# Patient Record
Sex: Female | Born: 1970 | Race: White | Hispanic: Yes | Marital: Married | Smoking: Never smoker
Health system: Southern US, Community
[De-identification: ages and names within clinical notes are randomized; demographics above are authoritative.]

## PROBLEM LIST (undated history)

## (undated) ENCOUNTER — Emergency Department (HOSPITAL_COMMUNITY): Admission: EM | Payer: No Typology Code available for payment source | Source: Home / Self Care

## (undated) DIAGNOSIS — E785 Hyperlipidemia, unspecified: Secondary | ICD-10-CM

## (undated) HISTORY — PX: INTRAUTERINE DEVICE INSERTION: SHX323

## (undated) HISTORY — DX: Hyperlipidemia, unspecified: E78.5

---

## 2000-11-01 ENCOUNTER — Encounter: Admission: RE | Admit: 2000-11-01 | Discharge: 2000-11-01 | Payer: Self-pay | Admitting: Family Medicine

## 2001-02-09 ENCOUNTER — Other Ambulatory Visit: Admission: RE | Admit: 2001-02-09 | Discharge: 2001-02-09 | Payer: Self-pay | Admitting: Gynecology

## 2001-02-28 ENCOUNTER — Encounter: Payer: Self-pay | Admitting: Gynecology

## 2001-02-28 ENCOUNTER — Ambulatory Visit (HOSPITAL_COMMUNITY): Admission: RE | Admit: 2001-02-28 | Discharge: 2001-02-28 | Payer: Self-pay | Admitting: Gynecology

## 2002-09-22 ENCOUNTER — Other Ambulatory Visit: Admission: RE | Admit: 2002-09-22 | Discharge: 2002-09-22 | Payer: Self-pay | Admitting: Gynecology

## 2006-10-28 ENCOUNTER — Ambulatory Visit: Payer: Self-pay | Admitting: Family Medicine

## 2007-02-22 ENCOUNTER — Ambulatory Visit: Payer: Self-pay | Admitting: Family Medicine

## 2011-03-29 HISTORY — PX: INTRAUTERINE DEVICE INSERTION: SHX323

## 2011-08-18 ENCOUNTER — Ambulatory Visit: Payer: Self-pay | Admitting: Gynecology

## 2011-09-10 ENCOUNTER — Ambulatory Visit: Payer: Self-pay | Admitting: Gynecology

## 2011-10-07 ENCOUNTER — Encounter: Payer: Self-pay | Admitting: Gynecology

## 2011-10-07 ENCOUNTER — Ambulatory Visit (INDEPENDENT_AMBULATORY_CARE_PROVIDER_SITE_OTHER): Payer: Self-pay | Admitting: Gynecology

## 2011-10-07 VITALS — BP 110/70 | Ht 60.0 in | Wt 141.0 lb

## 2011-10-07 DIAGNOSIS — Z01419 Encounter for gynecological examination (general) (routine) without abnormal findings: Secondary | ICD-10-CM

## 2011-10-08 NOTE — Progress Notes (Signed)
Patient presented to the office as a result of an abnormal Pap smear that she had had elsewhere (health Department) patient stated that she was informed to return back in 3 months for followup Pap smear and decided to come for office instead. Patient will reschedule this appointment so that we can obtain the path report and manage accordingly. This was explained to the patient in Spanish all questions are answered.

## 2011-10-20 ENCOUNTER — Encounter: Payer: Self-pay | Admitting: Gynecology

## 2011-10-20 ENCOUNTER — Ambulatory Visit (INDEPENDENT_AMBULATORY_CARE_PROVIDER_SITE_OTHER): Payer: Self-pay | Admitting: Gynecology

## 2011-10-20 ENCOUNTER — Other Ambulatory Visit (HOSPITAL_COMMUNITY)
Admission: RE | Admit: 2011-10-20 | Discharge: 2011-10-20 | Disposition: A | Discharge status: Home / Self Care | Payer: Self-pay | Source: Ambulatory Visit | Attending: Gynecology | Admitting: Gynecology

## 2011-10-20 VITALS — BP 110/70 | Ht 60.0 in | Wt 142.0 lb

## 2011-10-20 DIAGNOSIS — Z01419 Encounter for gynecological examination (general) (routine) without abnormal findings: Secondary | ICD-10-CM | POA: Insufficient documentation

## 2011-10-20 DIAGNOSIS — Z124 Encounter for screening for malignant neoplasm of cervix: Secondary | ICD-10-CM

## 2011-10-20 DIAGNOSIS — N870 Mild cervical dysplasia: Secondary | ICD-10-CM

## 2011-10-20 DIAGNOSIS — R87810 Cervical high risk human papillomavirus (HPV) DNA test positive: Secondary | ICD-10-CM | POA: Insufficient documentation

## 2011-10-20 NOTE — Progress Notes (Addendum)
Patient is a 40 year old gravida 3 para 3 who presented to the office today for followup Pap smear due to the fact that she was receiving her Pap smears and gynecological examinations at the [facility] health Department. In April of this year she had a ParaGard T380A IUD placed her Pap smear had demonstrated low-grade SIL and subsequently underwent colposcopic evaluation at same facility and pathology report demonstrated low-grade SIL and benign ECC cytology and histology both coincided.  Colposcopic evaluation done in the office today: External genitalia vagina with no gross lesions noted cervix IUD string was seen transformation zone was visualized no abnormalities were detected. Her Pap smear was repeated as well.  New guidelines and followup were discussed with the patient in Spanish. She has never had abnormal Pap smear before. Will followup in 6 months at time of her annual exam her Pap smear. We'll continue to monitor conservatively since she will have good compliance and will repeat colposcopy and high-grade abnormalities noted on cytology. Literature formation was provided as well. Application of her mammogram scar shipped was provided as well. All questions were answered we'll follow accordingly.

## 2011-10-20 NOTE — Progress Notes (Signed)
Addended by: Bertram Savin A on: 10/20/2011 10:57 AM   Modules accepted: Orders

## 2012-04-22 ENCOUNTER — Ambulatory Visit (INDEPENDENT_AMBULATORY_CARE_PROVIDER_SITE_OTHER): Payer: Self-pay | Admitting: Gynecology

## 2012-04-22 ENCOUNTER — Encounter: Payer: Self-pay | Admitting: Gynecology

## 2012-04-22 ENCOUNTER — Other Ambulatory Visit (HOSPITAL_COMMUNITY)
Admission: RE | Admit: 2012-04-22 | Discharge: 2012-04-22 | Disposition: A | Discharge status: Home / Self Care | Payer: Self-pay | Source: Ambulatory Visit | Attending: Gynecology | Admitting: Gynecology

## 2012-04-22 VITALS — BP 122/78 | Ht 60.0 in | Wt 137.0 lb

## 2012-04-22 DIAGNOSIS — Z01419 Encounter for gynecological examination (general) (routine) without abnormal findings: Secondary | ICD-10-CM

## 2012-04-22 DIAGNOSIS — Z833 Family history of diabetes mellitus: Secondary | ICD-10-CM

## 2012-04-22 DIAGNOSIS — I8390 Asymptomatic varicose veins of unspecified lower extremity: Secondary | ICD-10-CM

## 2012-04-22 DIAGNOSIS — I8393 Asymptomatic varicose veins of bilateral lower extremities: Secondary | ICD-10-CM

## 2012-04-22 DIAGNOSIS — R635 Abnormal weight gain: Secondary | ICD-10-CM

## 2012-04-22 LAB — LIPID PANEL
Cholesterol: 218 mg/dL — ABNORMAL HIGH (ref 0–200)
HDL: 69 mg/dL (ref >39)
LDL Cholesterol: 129 mg/dL — ABNORMAL HIGH (ref 0–99)
Total CHOL/HDL Ratio: 3.2 Ratio
Triglycerides: 100 mg/dL (ref <150)
VLDL: 20 mg/dL (ref 0–40)

## 2012-04-22 LAB — CBC WITH DIFFERENTIAL/PLATELET
Basophils Absolute: 0 10*3/uL (ref 0.0–0.1)
Basophils Relative: 0 % (ref 0–1)
Eosinophils Absolute: 0.1 10*3/uL (ref 0.0–0.7)
Eosinophils Relative: 2 % (ref 0–5)
HCT: 43.7 % (ref 36.0–46.0)
Hemoglobin: 13.6 g/dL (ref 12.0–15.0)
Lymphocytes Relative: 26 % (ref 12–46)
Lymphs Abs: 1.4 10*3/uL (ref 0.7–4.0)
MCH: 29.2 pg (ref 26.0–34.0)
MCHC: 31.1 g/dL (ref 30.0–36.0)
MCV: 94 fL (ref 78.0–100.0)
Monocytes Absolute: 0.3 10*3/uL (ref 0.1–1.0)
Monocytes Relative: 6 % (ref 3–12)
Neutro Abs: 3.5 10*3/uL (ref 1.7–7.7)
Neutrophils Relative %: 66 % (ref 43–77)
Platelets: 209 10*3/uL (ref 150–400)
RBC: 4.65 MIL/uL (ref 3.87–5.11)
RDW: 14.2 % (ref 11.5–15.5)
WBC: 5.3 10*3/uL (ref 4.0–10.5)

## 2012-04-22 LAB — GLUCOSE, RANDOM: Glucose, Bld: 93 mg/dL (ref 70–99)

## 2012-04-22 NOTE — Patient Instructions (Signed)
Control del colesterol  Los niveles de colesterol en el organismo estn determinados significativamente por su dieta. Los niveles de colesterol tambin se relacionan con la enfermedad cardaca. El material que sigue ayuda a explicar esta relacin y a analizar qu puede hacer para mantener su corazn sano. No todo el colesterol es malo. Las lipoprotenas de baja densidad (LDL) forman el colesterol "malo". El colesterol malo puede ocasionar depsitos de grasa que se acumulan en el interior de las arterias. Las lipoprotenas de alta densidad (HDL) es el colesterol "bueno". Ayuda a remover el colesterol LDL "malo" de la sangre. El colesterol es un factor de riesgo muy importante para la enfermedad cardaca. Otros factores de riesgo son la hipertensin arterial, el hbito de fumar, el estrs, la herencia y el peso.   El msculo cardaco obtiene el suministro de sangre a travs de las arterias coronarias. Si su colesterol LDL ("malo") est elevado y el HDL ("bueno") es bajo, tiene un factor de riesgo para que se formen depsitos de grasa en las arterias coronarias (los vasos sanguneos que suministran sangre al corazn). Esto hace que haya menos lugar para que la sangre circule. Sin la suficiente sangre y oxgeno, el msculo cardaco no puede funcionar correctamente, y usted podr sentir dolores en el pecho (angina pectoris). Cuando una arteria coronaria se cierra completamente, una parte del msculo cardaco puede morir (infarto de miocardio).  CONTROL DEL COLESTEROL Cuando el profesional que lo asiste enva la sangre al laboratorio para conocer el nivel de colesterol, puede realizarle tambin un perfil completo de los lpidos. Con esta prueba, se puede determinar la cantidad total de colesterol, as como los niveles de LDL y HDL. Los triglicridos son un tipo de grasa que circula en la sangre y que tambin puede utilizarse para determinar el riesgo de enfermedad  cardaca. En la siguiente tabla se establecen los nmeros ideales: Prueba: Colesterol total  Menos de 200 mg/dl.  Prueba: LDL "colesterol malo"  Menos de 100 mg/dl.   Menos de 70 mg/dl si tiene riesgo muy elevado de sufrir un ataque cardaco o muerte cardaca sbita.  Prueba: HDL "colesterol bueno"  Mujeres: Ms de 50 mg/dl.   Hombres: Ms de 40 mg/dl.  Prueba: Trigliceridos  Menos de 150 mg/dl.    CONTROL DEL COLESTEROL CON DIETA Aunque factores como el ejercicio y el estilo de vida son importantes, la "primera lnea de ataque" es la dieta. Esto se debe a que se sabe que ciertos alimentos hacen subir el colesterol y otros lo bajan. El objetivo debe ser equilibrar los alimentos, de modo que tengan un efecto sobre el colesterol y, an ms importante, reemplazar las grasas saturadas y trans con otros tipos de grasas, como las monoinsaturadas y las poliinsaturadas y cidos grasos omega-3 . En promedio, una persona no debe consumir ms de 15 a 17 g de grasas saturadas por da. Las grasas saturadas y trans se consideran grasas "malas", ya que elevan el colesterol LDL. Las grasas saturadas se encuentran principalmente en productos animales como carne, manteca y crema. Pero esto no significa que usted debe sacrificar todas sus comidas favoritas. Actualmente, como lo muestra el cuadro que figura al final de este documento, hay sustitutos de buen sabor, bajos en grasas y en colesterol, para la mayora de los alimentos que a usted le gusta comer. Elija aquellos alimentos alternativos que sean bajos en grasas o sin grasas. Elija cortes de carne del cuarto trasero o lomo ya que estos cortes son los que tienen menor cantidad de grasa   y colesterol. El pollo (sin piel), el pescado, la carne de ternera, y la pechuga de pavo molida son excelentes opciones. Elimine las carnes grasosas como los hotdogs o el salami. Los mariscos tienen poco o nada de grasas saturadas. Cuando consuma carne magra, carne de aves de  corral, o pescado, hgalo en porciones de 85 gramos (3 onzas). Las grasas trans tambin se llaman "aceites parcialmente hidrogenados". Son aceites manipulados cientficamente de modo que son slidos a temperatura ambiente, tienen una larga vida y mejoran el sabor y la textura de los alimentos a los que se agregan. Las grasas trans se encuentran en la margarina, masitas, crackers y alimentos horneados.  Para hornear y cocinar, el aceite es un excelente sustituto para la mantequilla. Los aceites monoinsaturados tienen un beneficio particular, ya que se cree que disminuyen el colesterol LDL (colesterol malo) y elevan el HDL. Deber evitar los aceites tropicales saturados como el de coco y el de palma.  Recuerde, adems, que puede comer sin restricciones los grupos de alimentos que son naturalmente libres de grasas saturadas y grasas trans, entre los que se incluyen el pescado, las frutas (excepto el aguacate), verduras, frijoles, cereales (cebada, arroz, cuzcuz, trigo) y las pastas (sin salsas con crema)   IDENTIFIQUE LOS ALIMENTOS QUE DISMINUYEN EL COLESTEROL  Pueden disminuir el colesterol las fibras solubles que estn en las frutas, como las manzanas, en los vegetales como el brcoli, las patatas y las zanahorias; en las legumbres como frijoles, guisantes y lentejas; y en los cereales como la cebada. Los alimentos fortificados con fitosteroles tambin pueden disminuir el colesterol. Debe consumir al menos 2 g de estos alimentos a diario para obtener el efecto de disminucin de colesterol.  En el supermercado, lea las etiquetas de los envases para identificar los alimentos bajos en grasas saturadas, libres de grasas trans y bajos en grasas, . Elija quesos que tengan solo de 2 a 3 g de grasa saturada por onza (28,35 g). Use una margarina que no dae el corazn, libre de grasas trans o aceite parcialmente hidrogenado. Al comprar alimentos horneados (galletitas dulces y galletas) evite el aceite parcialmente  hidrogenado. Los panes y bollos debern ser de granos enteros (harina de maz o de avena entera, en lugar de "harina" o "harina enriquecida"). Compre sopas en lata que no sean cremosas, con bajo contenido de sal y sin grasas adicionadas.   TCNICAS DE PREPARACIN DE LOS ALIMENTOS  Nunca fra los alimentos en aceite abundante. Si debe frer, hgalo en poco aceite y removiendo constantemente, porque as se utilizan muy pocas grasas, o utilice un spray antiadherente. Cuando le sea posible, hierva, hornee o ase las carnes y cocine los vegetales al vapor. En vez de aderezar los vegetales con mantequilla o margarina, utilice limn y hierbas, pur de manzanas y canela (para las calabazas y batatas), yogurt y salsa descremados y aderezos para ensaladas bajos en contenido graso.   BAJO EN GRASAS SATURADAS / SUSTITUTOS BAJOS EN GRASA  Carnes / Grasas saturadas (g)  Evite: Bife, corte graso (3 oz/85 g) / 11 g   Elija: Bife, corte magro (3 oz/85 g) / 4 g   Evite: Hamburguesa (3 oz/85 g) / 7 g   Elija:  Hamburguesa magra (3 oz/85 g) / 5 g   Evite: Jamn (3 oz/85 g) / 6 g   Elija:  Jamn magro (3 oz/85 g) / 2.4 g   Evite: Pollo, con piel (3 oz/85 g), Carne oscura / 4 g   Elija:  Pollo, sin piel (  3 oz/85 g), Carne oscura / 2 g   Evite: Pollo, con piel (3 oz/85 g), Carne magra / 2.5 g   Elija: Pollo, sin piel (3 oz/85 g), Carne magra / 1 g  Lcteos / Grasas saturadas (g)  Evite: Leche entera (1 taza) / 5 g   Elija: Leche con bajo contenido de grasa, 2% (1 taza) / 3 g   Elija: Leche con bajo contenido de grasa, 1% (1 taza) / 1.5 g   Elija: Leche descremada (1 taza) / 0.3 g   Evite: Queso duro (1 oz/28 g) / 6 g   Elija: Queso descremado (1 oz/28 g) / 2-3 g   Evite: Queso cottage, 4% grasa (1 taza)/ 6.5 g   Elija: Queso cottage con bajo contenido de grasa, 1% grasa (1 taza)/ 1.5 g   Evite: Helado (1 taza) / 9 g   Elija: Sorbete (1 taza) / 2.5 g   Elija: Yogurt helado sin contenido de  grasa (1 taza) / 0.3 g   Elija: Barras de fruta congeladas / vestigios   Evite: Crema batida (1 cucharada) / 3.5 g   Elija: Batidos glac sin lcteos (1 cucharada) / 1 g  Condimentos / Grasas saturadas (g)  Evite: Mayonesa (1 cucharada) / 2 g   Elija: Mayonesa con bajo contenido de grasa (1 cucharada) / 1 g   Evite: Manteca (1 cucharada) / 7 g   Elija: Margarina extra light (1 cucharada) / 1 g   Evite: Aceite de coco (1 cucharada) / 11.8 g   Elija: Aceite de oliva (1 cucharada) / 1.8 g   Elija: Aceite de maz (1 cucharada) / 1.7 g   Elija: Aceite de crtamo (1 cucharada) / 1.2 g   Elija: Aceite de girasol (1 cucharada) / 1.4 g   Elija: Aceite de soja (1 cucharada) / 2.4 g   Elija: Aceite de canola (1 cucharada) / 1 g  Document Released: 12/14/2005 Document Revised: 08/26/2011 ExitCare Patient Information 2012 [employer]. Ejercicios para perder peso (Exercise to Lose Weight) La actividad fsica y una dieta saludable ayudan a perder peso. El mdico podr sugerirle ejercicios especficos. IDEAS Y CONSEJOS PARA HACER EJERCICIOS  Elija opciones econmicas que disfrute hacer , como caminar, andar en bicicleta o los vdeos para ejercitarse.   Utilice las escaleras en lugar del ascensor.   Camine durante la hora del almuerzo.   Estacione el auto lejos del lugar de trabajo o estudio.   Concurra a un gimnasio o tome clases de gimnasia.   Comience con 5  10 minutos de actividad fsica por da. Ejercite hasta 30 minutos, 4 a 6 das por semana.   Utilice zapatos que tengan un buen soporte y ropas cmodas.   Elongue antes y despus de ejercitar.   Ejercite hasta que aumente la respiracin y el corazn palpite rpido.   Beba agua extra cuando ejercite.   No haga ejercicio hasta lastimarse, sentirse mareado o que le falte mucho el aire.  La actividad fsica puede quemar alrededor de 150 caloras.  Correr 20 cuadras en 15 minutos.   Jugar vley durante 45 a 60  minutos.   Limpiar y encerar el auto durante 45 a 60 minutos.   Jugar ftbol americano de toque.   Caminar 25 cuadras en 35 minutos.   Empujar un cochecito 20 cuadras en 30 minutos.   Jugar baloncesto durante 30 minutos.   Rastrillar hojas secas durante 30 minutos.   Andar en bicicleta 80 cuadras en 30 minutos.     Caminar 30 cuadras en 30 minutos.   Bailar durante 30 minutos.   Quitar la nieve con una pala durante 15 minutos.   Nadar vigorosamente durante 20 minutos.   Subir escaleras durante 15 minutos.   Andar en bicicleta 60 cuadras durante 15 minutos.   Arreglar el jardn entre 30 y 45 minutos.   Saltar a la soga durante 15 minutos.   Limpiar vidrios o pisos durante 45 a 60 minutos.  Document Released: 03/20/2011 Document Revised: 08/26/2011 Exit[employer] Patient Information 2012 [employer]. 

## 2012-04-22 NOTE — Progress Notes (Signed)
Christy Taylor 1971/04/28 [id number]   History:    41 y.o.  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 We had discussed the new screening guidelines and she was recommended to followup with me in 6 months which she is today for her Pap smear as well as her gynecological exam. Patient is having normal menstrual cycles. Patient with no prior mammograms. Patient complaining of some left lower leg varicosities. Patient with strong family history diabetes breast 2 brothers and her mother have the disease.  Past medical history,surgical history, family history and social history were all reviewed and documented in the EPIC chart.  Gynecologic History Patient's last menstrual period was 04/14/2012. Contraception: IUD Last Pap: 2012. Results were: Low-grade squamous intraepithelial lesion Last mammogram: No prior study. Results were: No prior study  Obstetric History OB History    Grav Para Term Preterm Abortions TAB SAB Ect Mult Living   3 3        3      # Outc Date GA Lbr Len/2nd Wgt Sex Del Anes PTL Lv   1 PAR     F SVD      2 PAR     M SVD      3 PAR     F SVD          ROS:  Was performed and pertinent positives and negatives are included in the history.  Exam: chaperone present  BP 122/78  Ht 5' (1.524 m)  Wt 137 lb (62.143 kg)  BMI 26.76 kg/m2  LMP 04/14/2012  Body mass index is 26.76 kg/(m^2).  General appearance : Well developed well nourished female. No acute distress HEENT: Neck supple, trachea midline, no carotid bruits, no thyroidmegaly Lungs: Clear to auscultation, no rhonchi or wheezes, or rib retractions  Heart: Regular rate and rhythm, no murmurs or  gallops Breast:Examined in sitting and supine position were symmetrical in appearance, no palpable masses or tenderness,  no skin retraction, no nipple inversion, no nipple discharge, no skin discoloration, no axillary or supraclavicular lymphadenopathy Abdomen: no palpable masses or tenderness, no rebound or guarding Extremities: no edema or skin discoloration or tenderness, left lower leg varicosities prominent raised but nontender.  Pelvic:  Bartholin, Urethra, Skene Glands: Within normal limits             Vagina: No gross lesions or discharge  Cervix: No gross lesions or discharge, IUD string seen  Uterus  anteverted, normal size, shape and consistency, non-tender and mobile  Adnexa  Without masses or tenderness  Anus and perineum  normal   Rectovaginal  normal sphincter tone without palpated masses or tenderness             Hemoccult not done. Rectal exam otherwise normal     Assessment/Plan:  41 y.o. female for annual exam with prior history of low-grade squamous intraepithelial lesion positive HPV confirmed by previous biopsy at the health department. Last Pap smear October 2012 with low-grade SIL. Pap smear repeated today. Mammogram will be scheduled for her baseline. Patient encouraged to do her monthly self breast examination. Patient will be referred to the vein clinic for her left lower extremity varicosities which are very prominent. She'll use support stockings while she is ambulating and working. Because  of her strong family history diabetes a fasting blood sugar will be drawn today and we'll also check a fasting lipid profile along with her CBC and urinalysis. We'll wait for the results of this Pap smear and manage accordingly. New guidelines had been discussed with the patient as well in Spanish.    Ok Edwards MD, 11:33 AM 04/22/2012

## 2012-04-23 LAB — URINALYSIS W MICROSCOPIC + REFLEX CULTURE
Bacteria, UA: NONE SEEN
Bilirubin Urine: NEGATIVE
Casts: NONE SEEN
Crystals: NONE SEEN
Glucose, UA: NEGATIVE mg/dL
Hgb urine dipstick: NEGATIVE
Ketones, ur: NEGATIVE mg/dL
Leukocytes, UA: NEGATIVE
Nitrite: NEGATIVE
Protein, ur: NEGATIVE mg/dL
Specific Gravity, Urine: 1.019 (ref 1.005–1.030)
Urobilinogen, UA: 0.2 mg/dL (ref 0.0–1.0)
pH: 8 (ref 5.0–8.0)

## 2013-07-18 ENCOUNTER — Encounter: Payer: Self-pay | Admitting: Gynecology

## 2013-10-19 ENCOUNTER — Other Ambulatory Visit (HOSPITAL_COMMUNITY): Payer: Self-pay | Admitting: *Deleted

## 2013-10-19 DIAGNOSIS — N6320 Unspecified lump in the left breast, unspecified quadrant: Secondary | ICD-10-CM

## 2013-10-24 ENCOUNTER — Ambulatory Visit (HOSPITAL_COMMUNITY)
Admission: RE | Admit: 2013-10-24 | Discharge: 2013-10-24 | Disposition: A | Discharge status: Home / Self Care | Payer: Self-pay | Source: Ambulatory Visit | Attending: Obstetrics and Gynecology | Admitting: Obstetrics and Gynecology

## 2013-10-24 ENCOUNTER — Encounter (INDEPENDENT_AMBULATORY_CARE_PROVIDER_SITE_OTHER): Payer: Self-pay

## 2013-10-24 ENCOUNTER — Encounter (HOSPITAL_COMMUNITY): Payer: Self-pay

## 2013-10-24 VITALS — BP 106/58 | Temp 98.4°F | Ht 60.0 in | Wt 134.0 lb

## 2013-10-24 DIAGNOSIS — Z1239 Encounter for other screening for malignant neoplasm of breast: Secondary | ICD-10-CM

## 2013-10-24 NOTE — Progress Notes (Signed)
Complaints of left breast lump x several months that has not changed since last mammogram. Complaints of left breast pain that feels like needles poking that comes and goes. Patient rated patient at a 3 out of 10. Patient stated the pain is about the same as it was at prior exam. Patient stated she does have an increase of breast pain prior to menstrual cycle. Patient had a diagnostic mammogram at [facility] June 2014 with 6 month left breast ultrasound follow up recommended for December 2014.  Pap Smear:    Pap smear not completed today. Last Pap smear was May 2014 by Dr. Quintella Reichert and normal per patient. Per patient has no history of an abnormal Pap smear. Pap smear 04/22/2012 is in EPIC. Last Pap smear is not in EPIC.  Physical exam: Breasts Breasts symmetrical. No skin abnormalities bilateral breasts. No nipple retraction bilateral breasts. No nipple discharge bilateral breasts. No lymphadenopathy. No lumps palpated bilateral breasts. Complaints of left center breast tenderness on exam. Referred patient to [facility] per recommendation for her 6 month follow up left breast ultrasound. Appointment scheduled for Friday, December 08, 2013 at 1000.       Pelvic/Bimanual No Pap smear completed today since last Pap smear was May 2014. Pap smear not indicated per BCCCP guidelines.

## 2013-10-24 NOTE — Patient Instructions (Addendum)
Taught Christy Taylor how to perform BSE. Patient did not need a Pap smear today due to last Pap smear was May 2014 per patient. Let her know BCCCP will cover Pap smears every 3 years unless has a history of abnormal Pap smears. Referred patient to [facility] per recommendation for her 6 month follow up left breast ultrasound. Appointment scheduled for Friday, December 08, 2013 at 1000. Patient aware of appointment and will be there. Let her know if she notices any changes prior to her appointment to call and will get her in sooner for follow up.  [name] verbalized understanding.  Lurene Robley, Kathaleen Maser, RN @T @ 1:57 PM

## 2013-11-07 ENCOUNTER — Other Ambulatory Visit: Payer: Self-pay

## 2014-01-17 ENCOUNTER — Encounter (HOSPITAL_COMMUNITY): Payer: Self-pay

## 2014-04-20 ENCOUNTER — Encounter (HOSPITAL_COMMUNITY): Payer: Self-pay | Admitting: *Deleted

## 2014-10-29 ENCOUNTER — Encounter (HOSPITAL_COMMUNITY): Payer: Self-pay

## 2015-02-22 ENCOUNTER — Encounter: Payer: Self-pay | Admitting: Medical

## 2015-02-22 ENCOUNTER — Ambulatory Visit (INDEPENDENT_AMBULATORY_CARE_PROVIDER_SITE_OTHER): Payer: BLUE CROSS/BLUE SHIELD | Admitting: Medical

## 2015-02-22 VITALS — BP 109/66 | HR 68 | Temp 98.0°F | Ht 60.0 in | Wt 138.0 lb

## 2015-02-22 DIAGNOSIS — Z0000 Encounter for general adult medical examination without abnormal findings: Secondary | ICD-10-CM

## 2015-02-22 DIAGNOSIS — Z23 Encounter for immunization: Secondary | ICD-10-CM

## 2015-02-22 HISTORY — DX: Encounter for general adult medical examination without abnormal findings: Z00.00

## 2015-02-22 NOTE — Assessment & Plan Note (Signed)
Will do labs cbc, cmp, tsh, lipid panel in one week fasting.  Pt advised to call gyn Lily PeerFernandez to repeat mammo and pap(since hx of abnromal).  Tdap offered today.  Counseled pt on her ha one month ago with rt trapezius pain. If at any point ha with associated sigs or symptoms as exlpained then ED evaluation.

## 2015-02-22 NOTE — Progress Notes (Signed)
   Subjective:    Patient ID: Christy Taylor, female    DOB: [birth year], 44 y.o.   MRN: [id number]  HPI   I have reviewed pt PMH, PSH, FH, Social History and Surgical History  Pt has hx of hyperlipidemia- Dx mild elevation. No med. Told diet and exercise.  Varicose veins- Lt side leg.  No operation or surgeries.  LMP- February. Has IUD.  [employer] work in [employer], [employer]. NO exercises, No smoker, NO alcohol. 3 children.  Pt has no current symptoms. Today.  Pt did mention one week ago had temporal ha and rt trapezius pain. Last for 10 minutes. Then went away. Husband massaged her neck. She felt rt arm weakness but states she could move arm fine and no numbness. This happened only one time. She was very stressed at that time.  No hx of ha.     Review of Systems  Constitutional: Negative for fever, chills, diaphoresis, activity change, appetite change and fatigue.  HENT: Negative for congestion.   Respiratory: Negative for cough, choking, chest tightness and shortness of breath.   Cardiovascular: Negative for chest pain, palpitations and leg swelling.  Gastrointestinal: Negative for nausea, vomiting and abdominal pain.  Musculoskeletal: Negative for back pain, neck pain and neck stiffness.  Neurological: Negative for dizziness, tremors, seizures, syncope, facial asymmetry, speech difficulty, weakness, light-headedness, numbness and headaches.       None presently but see hpi.  Psychiatric/Behavioral: Negative for behavioral problems, confusion and agitation. The patient is not nervous/anxious.        Objective:   Physical Exam   General Mental Status- Alert. General Appearance- Not in acute distress.   Skin General: Color- Normal Color. Moisture- Normal Moisture.  Neck Carotid Arteries- Normal color. Moisture- Normal Moisture. No carotid bruits. No JVD.  Chest and Lung Exam Auscultation: Breath Sounds:-Normal.  Cardiovascular Auscultation:Rythm- Regular. Murmurs & Other  Heart Sounds:Auscultation of the heart reveals- No Murmurs.  Abdomen Inspection:-Inspeection Normal. Palpation/Percussion:Note:No mass. Palpation and Percussion of the abdomen reveal- Non Tender, Non Distended + BS, no rebound or guarding.    Neurologic Cranial Nerve exam:- CN III-XII intact(No nystagmus), symmetric smile. Drift Test:- No drift. Romberg Exam:- Negative.  Heal to Toe Gait exam:-Normal. Finger to Nose:- Normal/Intact Strength:- 5/5 equal and symmetric strength both upper and lower extremities.  Back- no cva tenderness.       Assessment & Plan:

## 2015-02-22 NOTE — Patient Instructions (Signed)
Wellness examination Will do labs cbc, cmp, tsh, lipid panel in one week fasting.  Pt advised to call gyn Toney Rakes to repeat mammo and pap(since hx of abnromal).  Tdap offered today.  Counseled pt on her ha one month ago with rt trapezius pain. If at any point ha with associated sigs or symptoms as exlpained then ED evaluation.     Cuidados preventivos en los adultos (Preventive Care for Adults) Un estilo de vida saludable y los cuidados preventivos pueden favorecer la salud y [place]. Las pautas de salud preventivas para las mujeres incluyen las siguientes prcticas clave:  Un examen fsico de rutina anual y Optometrist estudios preventivos es un buen modo de Chief Technology Officer su salud. [place] de Publishing rights manager preocupaciones y Civil engineer, contracting el estado de su salud, y que le realicen estudios completos.  Consulte al dentista para realizar un examen de rutina y cuidados preventivos cada 6 meses. Cepllese los dientes al [employer]ys[employer] veces por da y psese el hilo dental al menos una vez por da. Una buena higiene bucal evita caries y enfermedades de las encas.  La frecuencia con que debe hacerse exmenes de la vista depende de su edad, su estado de [place], su historia familiar, el uso de lentes de contacto y otros factores. Siga las recomendaciones del mdico para saber con qu frecuencia debe hacerse exmenes de la vista.  Consuma una dieta saludable. Los alimentos que contienen vegetales, las frutas, los cereales [place], los productos lcteos bajos en grasas y las protenas magras contienen nutrientes que son necesarios, sin consumir Nurse, mental health. Disminuya la ingesta de alimentos ricos en grasas slidas, azcares y sal agregadas. Consuma la cantidad de caloras adecuada para usted. Si es necesario, pdale informacin acerca de una dieta [place] a su mdico.  Realizar actividad fsica de forma regular es una de las prcticas ms importantes que puede hacer por su salud. Los adultos  deben hacer al menos 150 minutos de ejercicios de intensidad moderada (cualquier actividad que aumente la frecuencia cardaca y lo haga transpirar) cada semana. Adems, la [employer] de los adultos necesita practicar ejercicios de fortalecimiento muscular dos o ms veces por semana.  Mantenga un peso saludable. El ndice de masa corporal [facility]) es una herramienta que identifica posibles problemas con [place]. Proporciona una estimacin de la grasa corporal basndose en el peso y la altura. El mdico podr determinar su [facility] y ayudarlo a Scientist, forensic o Theatre manager un peso saludable. Para los adultos de 20 aos o ms:  Un [facility] menor de 18,5 se considera bajo peso.  Un [facility] entre 18,5 y 24,9 es normal.  Un [facility] entre 25 y 29,9 se considera sobrepeso.  Un IMC de 30 o ms se considera obesidad.  Realice actividad fsica y evite ingerir grasas saturadas para mantener un nivel normal de lpidos y Research scientist (life sciences). Consuma una dieta equilibrada y saludable, e incluya variedad de frutas y Photographer. A partir de los 20 aos se deben realizar anlisis de sangre a fin de Freight forwarder nivel de lpidos y colesterol en la sangre y [place] cada 5 aos. Si los niveles de lpidos o colesterol son altos, tiene ms de 50 aos o tiene riesgo elevado de sufrir enfermedades cardacas, Designer, industrial/product controlarse con ms frecuencia. Si tiene Coca [employer]a de lpidos y colesterol, debe recibir tratamiento con medicamentos, si la dieta y el ejercicio no estn funcionando.  Si fuma, consulte con el mdico acerca de las opciones para dejar de [place]. Si no fuma, no comience.  Se recomienda realizar exmenes de deteccin de cncer de pulmn a personas adultas entre 23 y 59 aos que estn en riesgo de Horticulturist, commercial de pulmn por sus antecedentes de consumo de tabaco. Para quienes hayan fumado durante 30 aos un paquete diario, y sigan fumando o hayan dejado el hbito en algn momento en los ltimos 15 aos, se recomienda realizarse  una tomografa computarizada de baja dosis de los pulmones todos los aos. Fumar durante un ao-paquete equivale a fumar un promedio de un paquete de cigarrillos diario durante un ao (por ejemplo: un paquete por da durante [place] paquetes por da durante 15 aos). Los exmenes anuales deben continuar hasta que el fumador haya dejado de fumar durante un mnimo de 15 aos. Ya no deben Emergency planning/management officer que tengan un problema de salud que les impida recibir tratamiento para el cncer de pulmn.  Si est embarazada, no beba alcohol. Si est amamantando, beba alcohol con prudencia. Si no est embarazada y decide beber alcohol, no beba ms de Naval architect. Se considera una medida a 12onzas (389m) de cerveza, 5onzas (1419m de vino, o 1,[id number] licor.  Evite el consumo de drogas. No comparta agujas. Pida ayuda si necesita asistencia o instrucciones con respecto a abandonar el consumo de drogas.  La hipertensin arterial causa enfermedades cardacas y auSerbial riesgo de ictus. Debe controlar su presin arterial al menos cada uno o doGaffneyLa hipertensin arterial que persiste debe tratarse con medicamentos si la prdida de peso y el ejercicio no son efectivos.  Si tiene entre [phone], consulte a su mdico si debe tomar aspirina para prevenir ictus.  Los anlisis para la diabetes incluyen la toma de unTanzaniae sangre para controlar el nivel de azcar en la sangre durante el ayGrahamDebe hacerlo cada 3 aos despus de los 4552os si est dentro de su peso normal y sin factores de riesgo para la diabetes. Las pruebas deben comenzar a edades tempranas o llevarse a cabo con ms frecuencia si tiene sobrepeso y al menos un factor de riesgo para la diabetes.  Las evaluaciones para dePublic affairs consultante mama son un mtodo preventivo fundamental para las mujeres. Debe practicar la "autoconciencia de las mamas". Esto significa que deChief Technology Officerpariencia normal de sus  mamas y cmo se sienten, y haElectrical engineern autoexamen de maGlass blower/designerSi detecta algn cambio, no importa cun pequeo sea, debe informarlo a su mdico. Las muConAgra Foods0 y 3069os deben hacerse un examen clnico de las mamas como parte del examen regular de saPerrycada uno a tres aos. Despus de los [address]deben haCoca-ColaA partir de los [id number] Roberts Drivedeben hacerse una mamografa (radiografa de mamas) cada ao. Las mujeres con antecedentes familiares de cncer de mama deben hablar con el mdico para someterse a un estudio gentico. Las que tienen ms riesgo deben hacerse una resonancia magntica y unLavinia Sharpsodos loUtica La evaluacin del riesgo de cncer relacionado con el gen del cncer de mama (BRCA) se recomienda a mujeres que tengan familiares con cncer relacionado con el BRCA. Los cnceres relacionados con el BRCA incluyen el cncer de mama, de ovario, de trompas y peritoneal. TeRaynelle Janamiliares con estos cnceres puede estar asociado con un mayor riesgo de cambios dainos (mutaciones) en los genes del cncer de mama BRCA1 y BRCA2. Los resultados de la evaluacin determinarn la necesidad de asesoramiento gentico y de an[place]averc[place]  de BRCA1 y BRCA2.  Ya no se recomiendan los exmenes plvicos de rutina para la deteccin del cncer en las mujeres que no estn embarazadas que son consideradas sujetos de bajo riesgo de Best boy cncer de los rganos de la pelvis (ovarios, tero y vagina) y no tienen sntomas. Pregntele al mdico si un examen plvico de deteccin es adecuado para usted.  Si ha recibido un tratamiento para Science writer cervical o una enfermedad que podra causar cncer, necesitar realizarse una prueba de Papanicolaou y controles durante al menos 53 aos de concluido el [place]. Si no se ha hecho el Papanicolaou con regularidad, debern volver a evaluarse los factores de riesgo (como tener un nuevo compaero sexual), para Teacher, adult education si debe realizarse los estudios nuevamente. Algunas  mujeres sufren problemas mdicos que aumentan la probabilidad de Museum/gallery curator cncer de cuello del tero. En estos casos, el mdico podr [employer] se realicen controles y pruebas de Papanicolaou con ms frecuencia.  La prueba del VPH es un anlisis adicional que puede usarse para Film/video editor de cuello del tero. Esta prueba busca la presencia del virus que causa los cambios en el cuello. Las clulas que se recolectan durante la prueba de Papanicolaou pueden usarse para el VPH. Se debe realizar la prueba para la deteccin del VPH a mujeres de ms de 12 aos y a Midwife de cualquier edad [employer]OK de la prueba de Papanicolaou no sean claros. Despus de los 30 aos, las mujeres deben hacerse el anlisis para el VPH con la misma frecuencia que la prueba de Papanicolaou.  El cncer colorrectal puede detectarse y con frecuencia puede prevenirse. La mayor parte de los estudios de rutina se deben Medical laboratory scientific officer a Field seismologist a Proofreader de los 42 aos y [place] 6 aos. Sin embargo, el mdico podr aconsejarle que lo haga antes, si tiene factores de riesgo para el cncer de colon. Una vez por ao, el mdico le dar un kit de prueba para Hydrologist en la materia fecal. La utilizacin de un tubo con una pequea cmara en su extremo para examinar directamente el colon (sigmoidoscopa o colonoscopa), puede detectar formas tempranas de cncer colorrectal. Hable con su mdico si tiene 14 aos, edad en la que debe comenzar a Optometrist los estudios de [place]. El examen directo del colon debe repetirse cada 5 a 10 aos, hasta los 75 aos, excepto que se encuentren formas tempranas de plipos precancerosos o pequeos bultos.  Las personas con un riesgo mayor de Insurance risk surveyor hepatitis B deben realizarse anlisis para Futures trader virus. Se considera que tiene un alto riesgo de Museum/gallery curator hepatitis B si:  Naci en un pas donde la hepatitis B es frecuente. Pregntele a su mdico qu pases son considerados de Public affairs consultant.  Sus  padres nacieron en un pas de alto riesgo y usted no recibi una vacuna que lo proteja contra la hepatitis B (vacuna contra la hepatitis B).  [place].  [place] agujas para inyectarse drogas.  Vive o tiene sexo con alguien que tiene hepatitis B.  Recibe tratamiento de hemodilisis.  Toma ciertos medicamentos para Nurse, mental health, trasplante de rganos y afecciones autoinmunes.  Se recomienda realizar un anlisis de sangre para Hydrographic surveyor hepatitis C a todas las personas nacidas entre 1945 y 1965, y a todo aquel que sepa que tiene riesgo de haber contrado esta enfermedad.  Practique el sexo seguro. Use condones y evite las prcticas sexuales riesgosas para disminuir el contagio de enfermedades de transmisin sexual (ETS). Algunas ETS son  la gonorrea, clamidia, sfilis, tricomoniasis, herpes, VPH y el virus de inmunodeficiencia humana (VIH). El herpes, el VIH y [place] VPH son enfermedades virales que no tienen [place]. Pueden producir discapacidad, cncer y [employer].  Debe realizarse pruebas de deteccin de enfermedades de transmisin sexual (ETS), incluidas gonorrea y clamidia si:  Es sexualmente activa y es menor de 24aos.  Es mayor de 24aos, y Investment banker, operational informa que corre riesgo de tener este tipo de [place].  La actividad sexual ha cambiado desde que le hicieron la ltima prueba de deteccin y tiene un riesgo mayor de Best boy clamidia o Radio broadcast assistant. Pregntele al mdico si usted tiene riesgo.  Si tiene riesgo de infectarse por el VIH, se recomienda tomar diariamente un medicamento recetado para evitar la infeccin. Esto se conoce como profilaxis previa a la exposicin. Se considera que est en riesgo si:  Es Dalene Seltzer heterosexual, es sexualmente [place] y tiene ms riesgo de Museum/gallery curator una infeccin por VIH.  Se inyecta drogas.  Es sexualmente activo con una pareja que tiene VIH.  Consulte a su mdico para saber si tiene un alto riesgo de infectarse por el VIH. Si  opta por comenzar la profilaxis previa a la exposicin, primero debe realizarse anlisis de deteccin del VIH. Luego, le harn anlisis cada 78mses mientras est tomando los medicamentos para la profilaxis previa a la exposicin.  La osteoporosis es una enfermedad en la que los huesos pierden los minerales y la fuerza por el avance de la edad. El resultado pueden ser fracturas o quebraduras graves en l[place] El riesgo de osteoporosis puede identificarse con uArdelia Memsprueba de densidad sea. Las mujeres de ms de 639aos y las que tengan riesgos de sufrir fracturas u osteoporosis deben discutir las opciones de control con su mdico. Consulte a su mdico si debe tomar un suplemento de calcio o de vitamina D para reducir el riesgo de osteoporosis.  La menopausia est asociada a sntomas y riesgos fsicos. Se dispone de una terapia de reemplazo hormonal para disminuir los sntomas y lBeauregard Consulte a su mdico para saber si la terapia de reemplazo hormonal es conveniente para usted.  Utilice pantalla solar. Aplique pantalla solar de mKerry Doryy repetida a lo largo del dTraining and development officer Resgurdese del sol cuando la sombra sea ms pequea que usted. Protjase usando mangas y pantalones largos, un sombrero de ala ancha y anteojos para el sol todo el ao, siempre que se encuentre al [place]  Una vez por mes hgase un examen de la piel de todo el cuerpo usando un espejo para ver la espalda. Informe al mdico si aparecen nuevos lunares, o si nota que los que ya tena ahora tienen bordes iKeyes aumentaron su tamao y son ms grandes que una goma de lpiz o si su forma o color cambi.  Mantngase al da con las vacunas obligatorias .  Vacuna antigripal. Todas las personas adultas deben vacunarse cada ao.  Vacuna contra la difteria, ttanos y pAdvice worker(DT, DTPa). Las mujeres embarazadas deben recibir una dosis de la vacuna DTPa en cada embarazo. Se debe recibir la dosis independientemente de cunto  tiempo haya transcurrido desde la ltima dosis. Es preferible vacunarse entre la semana 272y 36de la gestacin. Una persona adulta que no haya recibido la vacuna DTPa anteriormente o que no sabe su estado de vacunacin debe recibir una dosis. Despus de esta dosis inicial, necesitar aplicarse un refuerzo de la vacuna contra la difteria y el ttanos (DT) cada 10  aos. Las personas adultas que no sepan o no hayan recibido la serie de vacunacin de tres dosis contra la difteria y el ttanos deben iniciar o finalizar una serie de vacunacin primaria, que incluye la dosis contra la difteria, el ttanos y Research officer, trade union (DTPa). Las personas adultas deben recibir una dosis de refuerzo de DT cada 10 aos.  Vacuna contra la varicela. Ardelia Mems persona adulta sin prueba de inmunidad a la varicela debe recibir dos dosis o una segunda dosis si recibi una dosis previamente. Las embarazadas sin prueba de inmunidad deben recibir la primera dosis despus del Media planner. Esta primera dosis se debe aplicar antes del alta del centro de salud. La segunda dosis debe aplicarse entre 4 y 8 semanas posteriores a la primera dosis.  Vacuna contra el virus del Engineer, technical sales (VPH). Las ConAgra Foods 13 y 64 aos que no hayan recibido la vacuna antes deben recibir la serie de 3 dosis. La vacuna no se recomienda en mujeres embarazadas. Sin embargo, no es Chartered loss adjuster una prueba de [place] antes de recibir una dosis. Si se descubre que una mujer est embarazada despus de recibir la dosis, no se Producer, television/film/video. En ese caso, las dosis restantes deben retrasarse hasta despus del embarazo. Se recomienda la vacuna para cualquier persona inmunodeprimida hasta la edad de 26 aos si no recibi [place] o ninguna de las dosis anteriormente. Durante la serie de 3 dosis, la segunda dosis debe [employer] 4 y 8 semanas posteriores a la primera dosis. La tercera dosis debe aplicarse 24 semanas despus de la primera dosis y 16  semanas despus de la segunda dosis.  Vacuna contra el herpes zoster. Se recomienda una dosis en personas [employer] de 32 aos a menos que sufran ciertas enfermedades.  [place] contra el sarampin, la rubola y las paperas (SRP) Los adultos nacidos antes de 1957 generalmente se consideran inmunes al sarampin y las paperas. Las Forensic scientist en [phone] o posteriormente deben recibir una o ms dosis de la vacuna SRP, a menos que [employer] Mutual [employer] Omaha contraindicacin para la vacuna o que tengan prueba de inmunidad a las tres enfermedades. Se debe aplicar una segunda dosis de rutina de la vacuna SRP al menos 28das despus de la primera dosis a estudiantes de escuelas terciarias, trabajadores de la salud o viajeros internacionales. Las personas que recibieron la vacuna inactivada contra el sarampin o algn tipo desconocido de vacuna contra el sarampin [place] y 1967 deben recibir dos dosis de la vacuna [place]. Las Advertising copywriter recibieron la vacuna inactivada contra las paperas o algn tipo desconocido de vacuna contra las paperas antes de 1979 y tienen un alto riesgo de infectarse con la enfermedad deben considerar vacunarse con dos dosis de la vacuna SRP. En las mujeres en edad frtil, debe determinarse la inmunidad contra la rubola. Si no hay prueba de inmunidad, las mujeres que no estn embarazadas deben vacunarse. Si no hay prueba de inmunidad, las mujeres que estn embarazadas deben retrasar la vacunacin hasta despus del [place]. Los trabajadores de [employer]	[place] no vacunados que nacieron antes de 1957 y que no tienen prueba de inmunidad contra el sarampin, la rubola y las paperas o no tienen confirmacin de laboratorio de la enfermedad deben considerar vacunarse contra el sarampin y las paperas con dos dosis de la vacuna [place], y contra la rubola con una dosis de la vacuna SRP.  Vacuna antineumoccica conjugada 13 valente (PCV13). Segn indicacin mdica, una persona que no conozca su historia de  vacunacin y no tenga registro de vacunas debe recibir la vacuna PCV13. Una persona de 19 aos o ms que tenga ciertas enfermedades y no se haya vacunado debe recibir una dosis de la vacuna PCV13. Despus de esta vacuna, se debe aplicar una dosis de la vacuna antineumoccica de polisacridos (PPSV23). La dosis de la vacuna PPSV23 se debe recibir al menos ocho semanas despus de la dosis de la vacuna PCV13. Una persona de 19 aos o ms, que tenga ciertas enfermedades y Recruitment consultant recibido una o ms dosis de la vacuna PPSV23 previamente debe recibir una dosis de la vacuna PCV13. La dosis de la vacuna TGG26 se debe aplicar uno o ms aos despus de la dosis de la vacuna PPSV23.  Vacuna antineumoccica de polisacridos (PPSV23). Si se indica la vacuna PCV13, primero debe recibir la vacuna PCV13. Todas las personas de 65 aos o mayores deben recibir la vacuna. Una Network engineer de 69 aos que tenga ciertas enfermedades se Teacher, English as a foreign language. Cleora Fleet persona que viva en un hogar de Mining engineer o en un centro de atencin durante mucho tiempo se debe vacunar. Un fumador adulto se Teacher, English as a foreign language. Las personas inmunodeprimidas o con otras enfermedades deben recibir ambas vacunas, PCV13 y PPSV23. Las personas infectadas con el virus de la inmunodeficiencia humana (VIH) deben recibir la vacuna lo antes posible despus del diagnstico. Se debe evitar la vacunacin durante tratamientos de quimioterapia y radioterapia. El uso de rutina de la vacuna PPSV23 no est recomendado para Teacher, early years/pre, personas nativas de [place] o [employer] de 65aos, salvo que tengan ciertas enfermedades que requieran la vacuna. Segn indicacin mdica, las personas que no conozcan su historia de vacunacin y no tengan registros de [place], deben recibir la vacuna PPSV23. Se recomienda una nica revacunacin 5 aos despus de recibir la primera dosis de PPSV23 para personas de 19 a 40 aos con insuficiencia renal crnica, sndrome nefrtico, asplenia o inmunodepresin.  Las [place] [employer] recibieron de una a dos dosis de PPSV23 antes de los 65 aos deben recibir otra dosis de [place] a los 65 aos de edad o posteriormente si pasaron cinco aos, como mnimo, desde la dosis anterior. Las dosis de PPSV23 no son necesarias para personas que ya recibieron la vacuna a los 63 aos o posteriormente.  Vacuna antimeningoccica. Los adultos con asplenia o con persistentes deficiencias de componentes terminales del complemento deben recibir dos dosis de la vacuna antimeningoccica conjugada tetravalente (MenACWY-D). Las dosis se deben Midwife con un mnimo de 2 meses de diferencia. Deben vacunarse los microbilogos que trabajan con ciertas bacterias meningoccicas, reclutas militares y personas que viajan o viven en pases con una alta tasa de meningitis. Los estudiantes universitarios de Tourist information centre manager la edad de 21 que vivan en una residencia estudiantil deben recibir una dosis si no se aplicaron la vacuna cuando cumplieron o despus de cumplir 16 aos. Las personas que sufren ciertas enfermedades de alto riesgo deben aplicarse una o ms dosis.  Vacuna contra la hepatitis A. Las Advertising copywriter deseen estar protegidas contra esta enfermedad, que sufren ciertas enfermedades de alto riesgo, que trabajan con animales infectados con hepatitis A, que trabajan en los laboratorios de investigacin de hepatitis A, o que viajan o trabajan en pases con una alta tasa de hepatitis A deben recibir la vacuna. Los personas que no fueron vacunadas previamente y Deno Etienne a tener un contacto cercano con una persona adoptada fuera del pas, deben recibir la vacuna durante los primeros [address] despus de su llegada a los [place]  Unidos desde un pas con una alta tasa de hepatitis A.  Vacuna contra la hepatitis B. Las [place] [employer] deseen estar protegidas contra esta enfermedad, que sufren ciertas enfermedades de alto riesgo, que puedan estar expuestas a sangre u otros fluidos corporales infecciosos, que  tienen contactos familiares o parejas sexuales con hepatitis B positivo, que sean clientes o trabajadores de ciertos centros de atencin, o que viajan o trabajan en pases con una alta tasa de hepatitis B deben recibir la vacuna.  Vacuna antihaemophilus influenzae tipo B (Hib). Una persona no vacunada previamente, que sufra de asplenia o de anemia drepanoctica, o que tenga una esplenectoma programada debe recibir una dosis de la vacuna Hib. Independientemente de la vacunacin previa, un paciente trasplantado con clulas madre hematopoyticas debe recibir Ardelia Mems serie de tres dosis, de 6 a 12 meses despus del trasplante exitoso. La vacuna Hib no est recomendada para personas adultas infectadas con VIH. Controles preventivos - Rayetta Pigg Entre 19 y 44aos  Control de la presin arterial.**/Cada uno a [employer].  Control de lpidos y colesterol.**Carma Lair cinco aos a partir de los 20 aos.  Examen clnico de Johnson [employer] Johnson.**Carma Lair 3 aos en las Principal Financial 20 y los 78 aos.  Evaluacin del riesgo de cncer relacionado con el BRCA.**/Para mujeres que tienen familiares con cncer relacionado con el BRCA (cncer de mama, de ovario, de trompas y peritoneal).  Prueba de Papanicolaou.**Carma Lair dos [employer]is[employer] 21 y los 18 aos. Cada tres aos a Proofreader de los 54 aos y [place] 51 o 34 aos, con una historia de tres pruebas de Papanicolaou normales consecutivas.  Pruebas de deteccin de VPH.**/Cada tres aos, a partir de los 42 aos y [place] 24 o 10 aos, con una historia de tres pruebas de Papanicolaou normales consecutivas.  Anlisis de sangre para la hepatitis C.**/Para toda persona con riesgos conocidos de hepatitis C.  Autoexamen de piel /todos los meses  [place] antigripal. [place] los aos  Vacuna contra la difteria, ttanos y Advice worker (DTPa, DT).**/Consulte a su mdico. Las mujeres embarazadas deben recibir una dosis de la vacuna DTPa en cada embarazo. Una dosis de DT cada 10  aos.  Vacuna contra la varicela.**/Consulte a su mdico. Las embarazadas sin prueba de inmunidad deben recibir la primera dosis despus del Media planner.  Vacuna contra el virus del Engineer, technical sales (VPH). /3 dosis en el curso de 6 meses, si tiene 15 aos o menos. La vacuna no se recomienda en mujeres embarazadas. Sin embargo, no es Chartered loss adjuster una prueba de [place] antes de recibir una dosis.  Vacuna contra el sarampin, la rubola y las paperas ([place]).Marland KitchenEarleen Newport aplicarse al menos una dosis de SRP si ha nacido en 1957 o despus. Podra tambin necesitar una 2.da dosis. En las mujeres en edad frtil, debe determinarse la inmunidad contra la rubola. Si no hay prueba de inmunidad, las mujeres que no estn embarazadas deben vacunarse. Si no hay prueba de inmunidad, las mujeres que estn embarazadas deben retrasar la vacunacin hasta despus del [place].  Vacuna antineumoccica conjugada 13 valente (PCV13).Marland KitchenCecille Amsterdam a su mdico.  Vacuna antineumoccica de polisacridos (PPSV23).**/De una a dos dosis si es fumador o si sufre Actuary.  Vacuna antimeningoccica.**/Si tiene entre 44 y 25 aos y es estudiante universitario de Editor, commissioning que vive en una residencia estudiantil o tiene alguna enfermedad grave, debe recibir Ardelia Mems dosis de esta vacuna. Podra tambin necesitar dosis de refuerzo.  Vacuna contra la hepatitis A.**/Consulte a su mdico.  Vacuna contra la hepatitis B.**/Consulte  a su mdico.  Vacuna antihaemophilus influenzae tipo B (Hib).**/Consulte a su mdico. Entre 40 y 44aos  Control de la presin arterial.**/Cada uno a [employer].  Control de lpidos y colesterol.**Carma Lair cinco aos a partir de los 20 aos.  Pruebas de deteccin de cncer de pulmn. /Todos los aos si tiene entre 43 y 46 aos, y si ha fumado durante 33 aos un paquete diario y sigue fumando o dej el hbito en algn momento en los ltimos 15 aos. Los estudios de Pharmacologist se interrumpen cuando haya  dejado de fumar durante al menos 15aos o si tiene un problema de salud que le impida recibir tratamiento para Science writer de pulmn.  Examen clnico de [employer].**/Todos los aos despus de los 40 aos.  Evaluacin del riesgo de cncer relacionado con el BRCA.**/Para mujeres que tienen familiares con cncer relacionado con el BRCA (cncer de mama, de ovario, de trompas y peritoneal).  Mamografa.**/Una vez por ao a partir de los 25 aos, siempre que tenga buena salud. Consulte a su mdico.  Prueba de Papanicolaou.Marland KitchenCarma Lair tres aos despus de los 68 aos y [place] 24 o 72 aos, con una historia de tres pruebas de Papanicolaou normales consecutivas.  Pruebas de deteccin de VPH.**/Cada tres aos, a partir de los 42 aos y [place] 62 o 52 aos, con una historia de tres pruebas de Papanicolaou normales consecutivas.  Prueba de sangre oculta en materia fecal. Carma Lair ao a partir de los 50 [employer] 75 aos. No tendr que hacerlo si se realiza una colonoscopa cada 10 aos.  Colonoscopa o sigmoidoscopa flexible.Marland KitchenCarma Lair 5 aos para la sigmoidoscopa flexible o cada 10 aos para la colonoscopa, a Proofreader de los 27 aos de edad y [place] 30 aos.  Anlisis de sangre para la hepatitis C.**/Para todas las personas nacidas entre 1945 y 1965, y a todo aquel que tenga un riesgo conocido de haber contrado esta enfermedad.  Autoexamen de piel /todos los meses  [place] antigripal. /Todos los aos  Vacuna contra la difteria, ttanos y Advice worker (DTPa/DT).**/Consulte a su mdico. Las mujeres embarazadas deben recibir una dosis de la vacuna DTPa en cada embarazo. Una dosis de DT cada 10 aos.  Vacuna contra la varicela.**/Consulte a su mdico. Las embarazadas sin prueba de inmunidad deben recibir la primera dosis despus del Media planner.  Vacuna contra el herpes zoster.Marland KitchenArdelia Mems dosis para adultos de 60 aos o ms.  Vacuna contra el sarampin, la rubola y las paperas ([place]).Marland KitchenEarleen Newport aplicarse  al menos una dosis de SRP si ha nacido en 1957 o despus. Podra tambin necesitar una 2.da dosis. En las mujeres en edad frtil, debe determinarse la inmunidad contra la rubola. Si no hay prueba de inmunidad, las mujeres que no estn embarazadas deben vacunarse. Si no hay prueba de inmunidad, las mujeres que estn embarazadas deben retrasar la vacunacin hasta despus del [place].  Vacuna antineumoccica conjugada 13 valente (PCV13).Marland KitchenCecille Amsterdam a su mdico.  Vacuna antineumoccica de polisacridos (PPSV23).**/De una a dos dosis si es fumador o si sufre Actuary.  Vacuna antimeningoccica.Marland KitchenCecille Amsterdam a su mdico.  Investment banker, operational contra la hepatitis A.**/Consulte a su mdico.  Vacuna contra la hepatitis B.**/Consulte a su mdico.  Vacuna antihaemophilus influenzae tipo B (Hib).**/Consulte a su mdico. Ms de 86 aos  Control de la presin arterial.**/Cada uno a International aid/development worker.  Control de lpidos y colesterol.**Carma Lair cinco aos a partir de los 20 aos.  Pruebas de deteccin de cncer de pulmn. /Todos los aos si tiene entre 42 y 66  aos, y si ha fumado durante 30 aos un paquete diario y sigue fumando o dej el hbito en algn momento en los ltimos 15 aos. Los estudios de Pharmacologist se interrumpen cuando haya dejado de fumar durante al menos 15aos o si tiene un problema de salud que le impida recibir tratamiento para Science writer de pulmn.  Examen clnico de [employer].**/Todos los aos despus de los 40 aos.  Evaluacin del riesgo de cncer relacionado con el BRCA.**/Para mujeres que tienen familiares con cncer relacionado con el BRCA (cncer de mama, de ovario, de trompas y peritoneal).  Mamografa.**/Una vez por ao a partir de los 24 aos, siempre que tenga buena salud. Consulte a su mdico.  Prueba de Papanicolaou.**Carma Lair tres aos despus de los 47 aos y [place] 65 o 15 aos, con tres pruebas de Papanicolaou normales consecutivas. Las pruebas pueden interrumpirse [employer] 48 y los 42 aos, si tiene tres pruebas de Papanicolaou normales consecutivas y ninguna prueba de Papanicolaou ni de VPH anormal en los ltimos 10 aos.  Pruebas de deteccin de VPH.**/Cada tres aos, a partir de los 29 aos y [place] 53 o 30 aos, con una historia de tres pruebas de Papanicolaou normales consecutivas. Las pruebas pueden interrumpirse [employer] 35 y los 44 aos, si tiene tres pruebas de Papanicolaou normales consecutivas y ninguna prueba de Papanicolaou ni de VPH anormal en los ltimos 10 aos.  Prueba de sangre oculta en materia fecal. Carma Lair ao a partir de los 50 [employer] 75 aos. No tendr que hacerlo si se realiza una colonoscopa cada 10 aos.  Colonoscopa o sigmoidoscopa flexible.Marland KitchenCarma Lair 5 aos para la sigmoidoscopa flexible o cada 10 aos para la colonoscopa, a Proofreader de los 43 aos de edad y [place] 88 aos.  Anlisis de sangre para la hepatitis C.**/Para todas las personas nacidas entre 1945 y 1965, y a todo aquel que tenga un riesgo conocido de haber contrado esta enfermedad.  Pruebas de deteccin de osteoporosis.Marland KitchenWesley Blas nica vez en mujeres de ms de 31 aos que tengan riesgo de fracturas u osteoporosis.  Autoexamen de piel /todos los meses  [place] antigripal. [place] los aos  Vacuna contra la difteria, ttanos y Advice worker (DTPa/DT).**/Una dosis de DT cada 10 aos.  Vacuna contra la varicela.**Cecille Amsterdam a su mdico.  Vacuna contra el herpes zoster.Marland KitchenArdelia Mems dosis para adultos de 60 aos o ms.  Vacuna antineumoccica conjugada 13 valente (PCV13).Marland KitchenCecille Amsterdam a su mdico.  Vacuna antineumoccica de polisacridos (PPSV23).Marland KitchenArdelia Mems dosis para todos los adultos de 65 aos o ms.  Vacuna antimeningoccica.Marland KitchenCecille Amsterdam a su mdico.  Investment banker, operational contra la hepatitis A.**/Consulte a su mdico.  Vacuna contra la hepatitis B.**/Consulte a su mdico.  Vacuna antihaemophilus influenzae tipo B (Hib).**/Consulte a su mdico. ** Los antecedentes familiares y  personales de riesgos y enfermedades pueden [place]ar[place] manager las recomendaciones del mdico. Document Released: 09/23/2005 Document Revised: 12/19/2013 ExitCare Patient Information 2015 [place], [place]. This information is not intended to replace advice given to you by your health care provider. Make sure you discuss any questions you have with your health care provider.

## 2015-02-22 NOTE — Progress Notes (Signed)
Pre visit review using our clinic review tool, if applicable. No additional management support is needed unless otherwise documented below in the visit note. 

## 2015-03-01 ENCOUNTER — Other Ambulatory Visit (INDEPENDENT_AMBULATORY_CARE_PROVIDER_SITE_OTHER): Payer: BLUE CROSS/BLUE SHIELD

## 2015-03-01 DIAGNOSIS — Z0189 Encounter for other specified special examinations: Secondary | ICD-10-CM

## 2015-03-01 DIAGNOSIS — Z0000 Encounter for general adult medical examination without abnormal findings: Secondary | ICD-10-CM

## 2015-03-01 LAB — CBC WITH DIFFERENTIAL/PLATELET
Basophils Absolute: 0 10*3/uL (ref 0.0–0.1)
Basophils Relative: 0.2 % (ref 0.0–3.0)
EOS PCT: 2 % (ref 0.0–5.0)
Eosinophils Absolute: 0.1 10*3/uL (ref 0.0–0.7)
HEMATOCRIT: 40.4 % (ref 36.0–46.0)
Hemoglobin: 13.7 g/dL (ref 12.0–15.0)
Lymphocytes Relative: 25.2 % (ref 12.0–46.0)
Lymphs Abs: 1.4 10*3/uL (ref 0.7–4.0)
MCHC: 33.9 g/dL (ref 30.0–36.0)
MCV: 87.8 fl (ref 78.0–100.0)
Monocytes Absolute: 0.4 10*3/uL (ref 0.1–1.0)
Monocytes Relative: 7 % (ref 3.0–12.0)
NEUTROS ABS: 3.6 10*3/uL (ref 1.4–7.7)
NEUTROS PCT: 65.6 % (ref 43.0–77.0)
Platelets: 195 10*3/uL (ref 150.0–400.0)
RBC: 4.6 Mil/uL (ref 3.87–5.11)
RDW: 13.5 % (ref 11.5–15.5)
WBC: 5.5 10*3/uL (ref 4.0–10.5)

## 2015-03-01 LAB — COMPREHENSIVE METABOLIC PANEL
ALBUMIN: 3.5 g/dL (ref 3.5–5.2)
ALK PHOS: 43 U/L (ref 39–117)
ALT: 17 U/L (ref 0–35)
AST: 12 U/L (ref 0–37)
BUN: 12 mg/dL (ref 6–23)
CALCIUM: 9 mg/dL (ref 8.4–10.5)
CO2: 28 mEq/L (ref 19–32)
CREATININE: 0.66 mg/dL (ref 0.40–1.20)
Chloride: 105 mEq/L (ref 96–112)
GFR: 103.59 mL/min (ref >60.00)
GLUCOSE: 101 mg/dL — AB (ref 70–99)
Potassium: 4.5 mEq/L (ref 3.5–5.1)
Sodium: 136 mEq/L (ref 135–145)
Total Bilirubin: 0.4 mg/dL (ref 0.2–1.2)
Total Protein: 6.7 g/dL (ref 6.0–8.3)

## 2015-03-01 LAB — TSH: TSH: 1.64 u[IU]/mL (ref 0.35–4.50)

## 2015-03-01 LAB — LIPID PANEL
CHOL/HDL RATIO: 3
Cholesterol: 197 mg/dL (ref 0–200)
HDL: 61.3 mg/dL (ref >39.00)
LDL CALC: 121 mg/dL — AB (ref 0–99)
NONHDL: 135.7
Triglycerides: 73 mg/dL (ref 0.0–149.0)
VLDL: 14.6 mg/dL (ref 0.0–40.0)

## 2015-03-04 ENCOUNTER — Telehealth: Payer: Self-pay | Admitting: Medical

## 2015-03-04 NOTE — Telephone Encounter (Signed)
Left message with daughter regarding mom  normal labs. Only explained sugar 3 points elevated. Lipid mild increased but better than last time. Diet, exercise and repeat lipid panel in 3 months fasting.

## 2015-03-15 ENCOUNTER — Other Ambulatory Visit (HOSPITAL_COMMUNITY)
Admission: RE | Admit: 2015-03-15 | Discharge: 2015-03-15 | Disposition: A | Discharge status: Home / Self Care | Payer: BLUE CROSS/BLUE SHIELD | Source: Ambulatory Visit | Attending: Gynecology | Admitting: Gynecology

## 2015-03-15 ENCOUNTER — Ambulatory Visit (INDEPENDENT_AMBULATORY_CARE_PROVIDER_SITE_OTHER): Payer: BLUE CROSS/BLUE SHIELD | Admitting: Gynecology

## 2015-03-15 ENCOUNTER — Encounter: Payer: Self-pay | Admitting: Gynecology

## 2015-03-15 VITALS — BP 118/76 | Ht 60.25 in | Wt 137.6 lb

## 2015-03-15 DIAGNOSIS — Z1151 Encounter for screening for human papillomavirus (HPV): Secondary | ICD-10-CM | POA: Diagnosis present

## 2015-03-15 DIAGNOSIS — I8393 Asymptomatic varicose veins of bilateral lower extremities: Secondary | ICD-10-CM

## 2015-03-15 DIAGNOSIS — Z87410 Personal history of cervical dysplasia: Secondary | ICD-10-CM

## 2015-03-15 DIAGNOSIS — Z01419 Encounter for gynecological examination (general) (routine) without abnormal findings: Secondary | ICD-10-CM | POA: Insufficient documentation

## 2015-03-15 NOTE — Addendum Note (Signed)
Addended by: Berna SpareASTILLO, BLANCA A on: 03/15/2015 11:13 AM   Modules accepted: Orders, SmartSet

## 2015-03-15 NOTE — Progress Notes (Signed)
Christy Taylor 04/15/1971 [id number]   History:    44 y.o.  for annual gyn exam who is only complaint is her discomfort on her left lower leg as a result of her severe varicosity. She would like to be referred to a vein specialist. Patient's primary care physician recently did her blood work and she scheduled to see him the end of this with discuss the results. I have looked over her lab results and appears that her lipid profile indicates that her LDL continues to be elevated with a value 121. 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 Her follow-up Pap smear in 2013 was normal. Patient reports normal menstrual cycle. She had a IUD placed at the health department several years ago and she does not recall if it is the 44-year-old 10 year IUD and we'll be checking the next few days. Patient's last mammogram was in 2014 and they noticed some asymmetry of the left breast and a small cyst and had recommended follow-up ultrasound in 6 months and patient has not done so.    Past medical history,surgical history, family history and social history were all reviewed and documented in the EPIC chart.  Gynecologic History Patient's last menstrual period was 03/07/2015. Contraception: IUD Last Pap: 2013. Results were: normal Last mammogram: 2014. Results were: See above  Obstetric History OB History  Gravida Para Term Preterm AB SAB TAB Ectopic Multiple Living  3 3        3     # Outcome Date GA Lbr Len/2nd Weight Sex Delivery Anes PTL Lv  3 Para     F Vag-Spont     2 Para     M Vag-Spont     1 Para     F Vag-Spont          ROS: A ROS was performed and pertinent positives and negatives are included in  the history.  GENERAL: No fevers or chills. HEENT: No change in vision, no earache, sore throat or sinus congestion. NECK: No pain or stiffness. CARDIOVASCULAR: No chest pain or pressure. No palpitations. PULMONARY: No shortness of breath, cough or wheeze. GASTROINTESTINAL: No abdominal pain, nausea, vomiting or diarrhea, melena or bright red blood per rectum. GENITOURINARY: No urinary frequency, urgency, hesitancy or dysuria. MUSCULOSKELETAL: No joint or muscle pain, no back pain, no recent trauma. DERMATOLOGIC: No rash, no itching, no lesions. ENDOCRINE: No polyuria, polydipsia, no heat or cold intolerance. No recent change in weight. HEMATOLOGICAL: No anemia or easy bruising or bleeding. NEUROLOGIC: No headache, seizures, numbness, tingling or weakness. PSYCHIATRIC: No depression, no loss of interest in normal activity or change in sleep pattern.  Left lower extremity varicosities   Exam: chaperone present  BP 118/76 mmHg  Ht 5' 0.25" (1.53 m)  Wt 137 lb 9.6 oz (62.415 kg)  BMI 26.66 kg/m2  LMP 03/07/2015  Body mass index is 26.66 kg/(m^2).  General appearance : Well developed well nourished female. No acute distress HEENT: Eyes: no retinal hemorrhage or exudates,  Neck supple, trachea midline, no carotid bruits, no thyroidmegaly Lungs: Clear to auscultation, no rhonchi or wheezes, or rib retractions  Heart: Regular rate and rhythm, no murmurs or gallops Breast:Examined in sitting and supine position were symmetrical in appearance, no palpable masses or tenderness,  no skin retraction, no nipple inversion, no nipple discharge, no skin discoloration, no axillary or supraclavicular lymphadenopathy Abdomen: no palpable masses or tenderness, no rebound or guarding Extremities: Left lower extremity varicosities  Pelvic:  Bartholin, Urethra, Skene Glands: Within normal limits             Vagina: No gross lesions or discharge  Cervix: No gross lesions or discharge, IUD string seen  Uterus   anteverted, normal size, shape and consistency, non-tender and mobile  Adnexa  Without masses or tenderness  Anus and perineum  normal   Rectovaginal  normal sphincter tone without palpated masses or tenderness             Hemoccult not indicated     Assessment/Plan:  44 y.o. female for annual exam who is uncertain what type of IUD she has and when it is scheduled to be removed. She will go by the health department with a placed at several years ago to find out which IUD and for how long it could for. She is to follow up with her primary care physician the end of this week to discuss the possibility of starting her some form was done because of her persistently elevated LDL. Her Pap smear was done today. Patient was given a requisition to schedule her overdue mammogram. She was encouraged to do her monthly breast exams. Patient will be referred to pain specialist to treat her severe left leg lower extremity varicosities. She was given a prescription to go to the medical supply store for a support stocking to be place up to the level of the thigh so which she's bending at work for long periods of time and will help with her discomfort.   Ok Edwards MD, 11:03 AM 03/15/2015

## 2015-03-15 NOTE — Patient Instructions (Signed)
Ciruga de venas varicosas (Varicose Vein Surgery) Las venas varicosas son venas que se han agrandado y tornado sinuosas. Las venas varicosas pequeas a veces se denominan "araas". Las vlvulas de las venas ayudan a que la sangre vaya desde las piernas hacia el corazn. Si estas vlvulas se daan, el flujo sanguneo retorna hacia atrs. La sangre regresa a las venas de la pierna, cerca de la superficie de la piel. Las venas se expanden y se agrandan debido a la mayor presin contra las paredes internas de las venas. Las Clinical research associatepersonas que permanecen de pie durante perodos prolongados corren ms riesgo de sufrir este problema. Tambin se ve con frecuencia durante el embarazo o en las personas que tienen sobrepeso. La ciruga de venas varicosas se hace para extraer las venas agrandadas. Esta ayuda a reducir Chief Technology Officerel dolor, las molestias y el riesgo de sangrado y de cogulos sanguneos que pueden formarse en estas venas. La ciruga tambin puede mejorar el aspecto esttico del rea afectada. Pueden usarse diversos mtodos quirrgicos. Su mdico analizar el mtodo ms adecuado para usted segn sus necesidades especficas. Por lo general, el tratamiento no implica la hospitalizacin ni una recuperacin prolongada e incmoda. Las tcnicas menos invasivas a menudo permiten que el tratamiento de las venas varicosas sea ambulatorio. INFORME A SU MDICO SOBRE:   Alergias.  Medicamentos administrados, que incluyen hierbas, gotas para los ojos, medicamentos con Engineer, drillingreceta, medicamentos de [place]venta libre y Control and instrumentation engineercremas.  Uso de corticoides (por va oral o cremas).  Uso de aspirinas o medicamentos anticoagulantes.  Antecedentes de sangrado o problemas sanguneos.  Problemas anteriores relacionados con los anestsicos o la novocana.  Posibilidad de embarazo, si correspondiera.  Antecedentes de cogulos sanguneos (tromboflebitis).  Cirugas previas.  Otros problemas de [place]ud. RIESGOS Y COMPLICACIONES  Cogulos sanguneos en  las venas profundas (tromboflebitis).  Infeccin.  lcera en la piel (laceracin de la piel).  Venas varicosas recurrentes.  Si recibe escleroterapia (consulte ms abajo), estos sntomas son poco frecuentes, pero posibles:  Escozor temporal o calambres dolorosos en la zona donde se administr la inyeccin.  reas rojas elevadas y temporales, en la zona de la piel donde se administr la inyeccin.  Pequeas llagas temporales en la zona de la piel donde se aplic la inyeccin.  Hematomas temporales en la zona donde se aplic la inyeccin.  Puntos alrededor de la vena tratada, que por lo general desaparecen.  Lneas marrones alrededor de la vena tratada, que por lo general desaparecen.  Grupos de vasos sanguneos rojos y finos alrededor de la vena tratada, que por lo general desaparecen.  La vena tratada tambin puede inflamarse o presentar bultos de sangre coagulada. Esto no es peligroso. ANTES DEL PROCEDIMIENTO  Si est usando algn anticoagulante o un medicamento para la artritis, es posible que deba dejarlo temporalmente antes del procedimiento. Asegrese de hablar al respecto con su mdico antes de la [place]. PROCEDIMIENTO Hay diversos tipos de cirugas disponibles para el tratamiento seguro y Engineer, manufacturingeficaz de las venas varicosas:   Regulatory affairs officerscleroterapia. El Financial controllermdico inyecta en las venas varicosas pequeas y medianas una solucin que las cicatriza y las Water quality scientistcierra. En unas semanas, las venas varicosas tratadas deben desaparecer. Es posible que la inyeccin deba aplicarse ms de una vez en algunas venas. La escleroterapia es eficaz si se hace correctamente. La escleroterapia no requiere anestesia y Higher education careers adviserpuede hacerse en el consultorio del mdico.  Museum/gallery exhibitions officerCiruga lser. Los mdicos usan lser en las venas varicosas ms pequeas. La ciruga lser enva fuertes rfagas de Cox Communicationsluz hacia la vena, que hacen que  la vena desaparezca lentamente. No se usan incisiones ni agujas. Por lo general, todo el procedimiento demora  menos de 45 minutos.  Procedimientos asistidos por catter. Estos procedimientos por lo general se hacen para las venas varicosas ms grandes. En uno de estos tratamientos, el mdico inserta un tubo delgado (catter) en una vena agrandada y calienta la punta del catter. A medida que el catter se extrae, el calor destruye la vena haciendo que colapse y se cierre.  Extirpacin de las venas. Este procedimiento consiste en extraer una vena larga a travs de pequeas incisiones. Es un procedimiento ambulatorio para la [employer], pero no para todas. La extraccin de la vena no afecta la circulacin de la pierna, porque las venas ms profundas de la pierna se International aid/development worker de los mayores volmenes de [place].  Flebectoma ambulatoria. El mdico extrae las venas varicosas ms pequeas mediante una serie de punciones diminutas en la piel. En este procedimiento ambulatorio, se [place] anestesia local. Las cicatrices suelen ser mnimas.  Ciruga venosa endoscpica. Podra necesitar esta operacin solo en un caso grave de lceras en la pierna. El mdico [place] una delgada cmara de video y la introduce en la pierna para cerrar las venas varicosas. Luego, extrae las venas a travs de pequeas incisiones. DESPUS DEL PROCEDIMIENTO   Pueden pedirle que use una media de compresin durante 3a 5das para garantizar que las venas tratadas se mantengan cerradas.  Si estaba tomando algn anticoagulante o un medicamento para la artritis, hable con su mdico acerca de cundo debe comenzar a tomarlo de nuevo. INSTRUCCIONES PARA EL CUIDADO EN EL HOGAR   Es posible que deba levantar la pierna tratada durante una parte del da los primeros 2 o 3das despus del tratamiento.  Es posible que deba seguir usando una media de compresin para reducir las probabilidades de tener venas varicosas recurrentes.  Reinicie los medicamentos recetados y de venta libre que toma con regularidad segn las instrucciones de su  mdico. SOLICITE ATENCIN MDICA SI:   Presenta una temperatura bucal superior a 38,9 C (102 F) que no responde a los medicamentos.  Nota que el dolor aumenta y que no cede adecuadamente ante el medicamento recetado.  Nota pus o supuracin de lquido de del lugar de la incisin 2 o ms das despus de la [place]. SOLICITE ATENCIN MDICA DE INMEDIATO SI:   Aumenta el dolor o la hinchazn en la pantorrilla o la pierna.  Nota lneas rojas que Engineer, manufacturing systems de la incisin y se extienden [place] arriba o hacia abajo.  Presenta una temperatura bucal superior a 38,9 C (102 F) que no responde a los medicamentos.  Comienza sbitamente a Administrator, arts, le falta el aire, tiene dificultad para respirar o comienza a toser y a Community education officer.  Presenta dolor abdominal sin motivo aparente.  Presenta ms hematomas o se le hincha una articulacin de forma repentina. Document Released: 10/04/2013 [facility] Patient Information 2015 [place], [place]. This information is not intended to replace advice given to you by your health care provider. Make sure you discuss any questions you have with your health care provider. Venas varicosas sangrantes  (Bleeding Varicose Veins) Las venas varicosas son venas que se han agrandado y se tornan sinuosas. Las vlvulas de las venas ayudan al retorno de la sangre desde las piernas hacia el corazn. Si estas vlvulas sufren una lesin, el flujo sanguneo retorna hacia atrs y [employer] venas de la pierna, cerca de la piel. Esto hace  que las venas se agranden debido al aumento de la presin interior. En algunos casos las venas sangran.  CAUSAS  Los factores que pueden causar sangrado en las venas varicosas son:   Adelgazamiento de la piel que cubre las venas. Esta piel se estira a medida que las venas se agrandan.  Debilidad y adelgazamiento de las paredes de las venas varicosas. Estas paredes delgadas son [place] de las causas por las que la  sangre no fluye normalmente hacia corazn.  Presin alta en las venas. La alta presin se debe a que la sangre no fluye libremente Special educational needs teacher.  Traumatismos. Incluso una pequea lesin en una vena varicosa puede producir sangrado.  Heridas abiertas. En la zona de una vena varicosa puede formarse una lcera y no curarse. Esto favorece el sangrado.  Tomar anticoagulantes. Entre ellos se incluye la aspirina, los medicamentos antiinflamatorios y Health and safety inspector. SNTOMAS  Si el sangrado ocurre en la superficie exterior de la piel, la sangre puede verse. A veces, el sangrado ocurre debajo de la piel. En este caso, podr observarse una zona azul o prpura que se extender ms all de la vena. Este cambio del color puede ser visible.  DIAGNSTICO  Para diagnosticar una vena varicosa que sangra, el mdico:   Preguntar acerca de sus sntomas. Cundo fue la primera Publishing copy.  Durante cunto tiempo ha tenido vrices y Chief Technology Officer causan problemas.  Preguntar acerca de su salud en general.  Tambin sobre las posibles causas, como cortes recientes o si se ha golpeado o lastimado cerca de las venas varicosas.  Examinar la piel o la pierna que le preocupa. Palpar las venas.  Solicitar estudios de diagnstico por imgenes. Con este estudio podr observarse una imagen detallada de las venas. TRATAMIENTO  El Civil engineer, contracting del tratamiento de las varices que sangran es detener el sangrado. El objetivo es impedir que el sangrado vuelva a Scientific laboratory technician. El tratamiento depender de la causa de la hemorragia y su gravedad. Pregunte a su mdico qu sera lo mejor para usted. Las opciones incluyen:   Lexicographer (elevar) la pierna. Acostarse con la pierna apoyada en una almohada o cojn. El pie debe estar por encima del nivel del corazn.  Aplicar presin en la zona que est sangrando. El sangrado debe detenerse en un corto tiempo.  El uso de medias elsticas para "comprimir" las piernas (medias de  compresin). Una venda elstica podra tener el mismo [place].  Aplicacin de una crema antibitica en las lceras que no cicatrizan.  La eliminacin [place] o cierre de las venas varicosas que sangran. INSTRUCCIONES PARA EL CUIDADO EN EL HOGAR   Aplique las cremas que su mdico le haya recetado. Siga cuidadosamente las indicaciones.  Use las medias de compresin o cualquier vendaje especial que se le indique. Asegrese de saber:  Si debe usarlas todos los [place].  Durante cunto tiempo tendr [employer].  Si las venas se eliminaron o se cerraron, tendr un vendaje (apsito ) cubriendo la zona. Asegrese de saber:  Con qu frecuencia debe cambiar el apsito.  Si la zona se puede mojar.  Cuando usted puede dejar la piel al descubierto.  Revise la [employer]. Observe si aparecen nuevas llagas y signos de sangrado.  Para prevenir futuras hemorragias:  Tenga especial cuidado en situaciones en las que se podra lastimar las piernas. Por ejemplo, durante la depilacin, o si trabaja en el exterior o en el jardn.  Trate de [employer] piernas elevadas el mayor tiempo posible.  Recustese siempre que pueda. SOLICITE ATENCIN MDICA SI:   Usted tiene alguna duda sobre cmo usar medias de compresin o las vendas elsticas.  Las venas continan sangrando.  Aparecen lceras cerca de las venas varicosas.  Tiene una lcera que no se [place] o se agranda.  El dolor en la pierna [place].  El rea alrededor de la vena varicosa est   caliente, roja o sensible al tacto.  Supura un lquido amarillento que huele mal en el lugar donde estaba sangrando.  La temperatura eleva a ms de 100.5 F (38.1 C). SOLICITE ATENCIN MDICA DE INMEDIATO SI:   La temperatura eleva a ms de 102 F (38,9 C). Document Released: 09/07/2012 [facility] Patient Information 2015 [place], [place]. This information is not intended to replace advice given to you by your health care provider. Make sure you  discuss any questions you have with your health care provider.

## 2015-03-18 LAB — CYTOLOGY - PAP

## 2015-03-27 ENCOUNTER — Encounter: Payer: Self-pay | Admitting: Gynecology

## 2015-09-03 ENCOUNTER — Other Ambulatory Visit: Payer: Self-pay

## 2015-09-03 DIAGNOSIS — I8390 Asymptomatic varicose veins of unspecified lower extremity: Secondary | ICD-10-CM

## 2015-09-18 ENCOUNTER — Encounter: Payer: Self-pay | Admitting: Vascular Surgery

## 2015-09-20 ENCOUNTER — Ambulatory Visit (HOSPITAL_COMMUNITY)
Admission: RE | Admit: 2015-09-20 | Discharge: 2015-09-20 | Disposition: A | Discharge status: Home / Self Care | Payer: BLUE CROSS/BLUE SHIELD | Source: Ambulatory Visit | Attending: Vascular Surgery | Admitting: Vascular Surgery

## 2015-09-20 ENCOUNTER — Ambulatory Visit (INDEPENDENT_AMBULATORY_CARE_PROVIDER_SITE_OTHER): Payer: BLUE CROSS/BLUE SHIELD | Admitting: Vascular Surgery

## 2015-09-20 ENCOUNTER — Encounter: Payer: Self-pay | Admitting: Vascular Surgery

## 2015-09-20 VITALS — BP 109/69 | HR 57 | Temp 98.7°F | Ht 60.0 in | Wt 139.6 lb

## 2015-09-20 DIAGNOSIS — I868 Varicose veins of other specified sites: Secondary | ICD-10-CM

## 2015-09-20 DIAGNOSIS — I8390 Asymptomatic varicose veins of unspecified lower extremity: Secondary | ICD-10-CM | POA: Insufficient documentation

## 2015-09-20 DIAGNOSIS — I8393 Asymptomatic varicose veins of bilateral lower extremities: Secondary | ICD-10-CM

## 2015-09-20 DIAGNOSIS — R609 Edema, unspecified: Secondary | ICD-10-CM | POA: Diagnosis not present

## 2015-09-20 DIAGNOSIS — I872 Venous insufficiency (chronic) (peripheral): Secondary | ICD-10-CM | POA: Insufficient documentation

## 2015-09-20 NOTE — Progress Notes (Signed)
Reason for referral: left leg varicose veins   History of Present Illness  Christy Taylor is a 44 y.o. (1971/11/17) female who presents with chief complaint: increasing left leg varicose veins.  Patient notes, varicose veins in L>R leg 5 years ago, enlarging with each pregnancy especially last pregnancy.  The patient's symptoms include: increase protrusion and swelling.  The patient has had no history of DVT, known history of pregnancy, known history of varicose vein, no history of venous stasis ulcers, no history of  Lymphedema and no history of skin changes in lower legs.  There is no family history of venous disorders.  The patient has never used compression stockings in the past.   Past Medical History  Diagnosis Date  . Hyperlipidemia     Past Surgical History  Procedure Laterality Date  . Intrauterine device insertion  APRIL 2012    MIRENA    Social History   Social History  . Marital Status: Married    Spouse Name: N/A  . Number of Children: N/A  . Years of Education: N/A   Occupational History  . Not on file.   Social History Main Topics  . Smoking status: Never Smoker   . Smokeless tobacco: Never Used  . Alcohol Use: No  . Drug Use: No  . Sexual Activity: Yes    Birth Control/ Protection: IUD     Comment: PARAGUARD.. 1st intercourse- 17, partners- 1   Other Topics Concern  . Not on file   Social History Narrative     Family History  Problem Relation Age of Onset  . Diabetes Sister   . Hypertension Sister   . Diabetes Brother   . Diabetes Brother   . Diabetes Mother     Medications: none  No Known Allergies   REVIEW OF SYSTEMS:  (Positives checked otherwise negative)  CARDIOVASCULAR:    chest pain,   chest pressure,   palpitations,   shortness of breath when laying flat,   shortness of breath with exertion,    pain in feet when walking,   pain in feet when laying flat,  history of blood clot in veins (DVT),     history of phlebitis,   swelling in legs,   varicose veins  PULMONARY:    productive cough,   asthma,   wheezing  NEUROLOGIC:    weakness in arms or legs,   numbness in arms or legs,   difficulty speaking or slurred speech,   temporary loss of vision in one eye,   dizziness  HEMATOLOGIC:    bleeding problems,   problems with blood clotting too easily  MUSCULOSKEL:    joint pain,  joint swelling  GASTROINTEST:    vomiting blood,   blood in stool     GENITOURINARY:    burning with urination,   blood in urine  PSYCHIATRIC:    history of major depression  INTEGUMENTARY:    rashes,   ulcers  CONSTITUTIONAL:    fever,   chills   Physical Examination  Filed Vitals:   09/20/15 0949  BP: 109/69  Pulse: 57  Temp: 98.7 F (37.1 C)  TempSrc: Oral  Height: 5' (1.524 m)  Weight: 139 lb 9.6 oz (63.322 kg)  SpO2: 100%   Body mass index is 27.26 kg/(m^2).  General: A&O x  3, WDWN   Head: Sunrise/AT  Ear/Nose/Throat: Hearing grossly intact, nares without erythema or drainage, oropharynx without Erythema/Exudate, Mallampati score: 3  Eyes: PERRLA, EOMI  Neck: Supple, no nuchal rigidity, no palpable LAD  Pulmonary: Sym exp, good air movt, CTAB, no rales, rhonchi, & wheezing  Cardiac: RRR, Nl S1, S2, no Murmurs, rubs or gallops  Vascular: Vessel Right Left  Radial Palpable Palpable  Brachial Palpable Palpable  Carotid Palpable, without bruit Palpable, without bruit  Aorta Not palpable N/A  Femoral Palpable Palpable  Popliteal Not palpable Not palpable  PT Palpable Palpable  DP Palpable Palpable   Gastrointestinal: soft, NTND, no G/R, no HSM, no masses, no CVAT B  Musculoskeletal: M/S 5/5 throughout , Extremities without ischemic changes , no LDS, no edema, protrubent L high and calf varicosities, R varicosities smaller  Neurologic: CN 2-12 intact , Pain and light touch intact in  extremities , Motor exam as listed above  Psychiatric: Judgment intact, Mood & affect appropriate for pt's clinical situation  Dermatologic: See M/S exam for extremity exam, no rashes otherwise noted  Lymph : No Cervical, Axillary, or Inguinal lymphadenopathy    Non-Invasive Vascular Imaging  BLE Venous Insufficiency Duplex (Date: 09/20/2015):   RLE:   no DVT and SVT,   + GSV reflux,   no SSV reflux,  + deep venous reflux: SFJ, CFV  LLE:  no DVT and SVT,   + GSV reflux,   no SSV reflux,  + deep venous reflux: CFV, FV, PV   Medical Decision Making  Christy Taylor is a 44 y.o. female who presents with: BLE chronic venous insufficiency (C2), varicose veins with complications   Based on the patient's history and examination, I recommend: compressive therapy.  I discussed with the patient the use of her 20-30 mm thigh high compression stockings and need for 3 month trial of such.  The patient will follow up in 3 months with my [facility] for evaluation for: L GSV EVLA.  R GSV might need intervention at some point but I doubt the right leg meets criteria at this time.  Thank you for allowing [place] to participate in this patient's care.   Leonides Sake, MD [facility] Office: [phone] Pager: [phone]  09/20/2015, 10:16 AM

## 2015-10-18 ENCOUNTER — Ambulatory Visit (HOSPITAL_BASED_OUTPATIENT_CLINIC_OR_DEPARTMENT_OTHER)
Admission: RE | Admit: 2015-10-18 | Discharge: 2015-10-18 | Disposition: A | Discharge status: Home / Self Care | Payer: BLUE CROSS/BLUE SHIELD | Source: Ambulatory Visit | Attending: Medical | Admitting: Medical

## 2015-10-18 ENCOUNTER — Ambulatory Visit (INDEPENDENT_AMBULATORY_CARE_PROVIDER_SITE_OTHER): Payer: BLUE CROSS/BLUE SHIELD | Admitting: Medical

## 2015-10-18 ENCOUNTER — Encounter: Payer: Self-pay | Admitting: Medical

## 2015-10-18 VITALS — BP 100/70 | HR 76 | Temp 98.1°F | Ht 60.0 in | Wt 140.6 lb

## 2015-10-18 DIAGNOSIS — R51 Headache: Secondary | ICD-10-CM | POA: Insufficient documentation

## 2015-10-18 DIAGNOSIS — M542 Cervicalgia: Secondary | ICD-10-CM | POA: Diagnosis not present

## 2015-10-18 DIAGNOSIS — R519 Headache, unspecified: Secondary | ICD-10-CM

## 2015-10-18 DIAGNOSIS — M79601 Pain in right arm: Secondary | ICD-10-CM | POA: Diagnosis present

## 2015-10-18 DIAGNOSIS — F439 Reaction to severe stress, unspecified: Secondary | ICD-10-CM

## 2015-10-18 DIAGNOSIS — M25511 Pain in right shoulder: Secondary | ICD-10-CM | POA: Diagnosis not present

## 2015-10-18 DIAGNOSIS — Z658 Other specified problems related to psychosocial circumstances: Secondary | ICD-10-CM

## 2015-10-18 LAB — SEDIMENTATION RATE: Sed Rate: 18 mm/hr (ref 0–22)

## 2015-10-18 MED ORDER — KETOROLAC TROMETHAMINE 60 MG/2ML IM SOLN
60.0000 mg | Freq: Once | INTRAMUSCULAR | Status: AC
  Administered 2015-10-18: 60 mg via INTRAMUSCULAR

## 2015-10-18 MED ORDER — CYCLOBENZAPRINE HCL 5 MG PO TABS: ORAL_TABLET | ORAL | Status: DC

## 2015-10-18 MED ORDER — DICLOFENAC SODIUM 75 MG PO TBEC: 75.0000 mg | DELAYED_RELEASE_TABLET | Freq: Two times a day (BID) | ORAL | Status: DC

## 2015-10-18 NOTE — Progress Notes (Signed)
Subjective:    Patient ID: Christy Taylor, female    DOB: [birth year], 44 y.o.   MRN: [id number]  HPI   Pt in with ha on Sunday. Pt states pain for 4 hours. Lt eye hurt little. Was red. But no vision change. Pt states took advil. 2 tabs. Pt states pain stopped quickly. Then on Monday had some mild rt side side temporal faint pain. Also some mild rt neck pain. Mild rt shoulder pain.  Pt no nausea, no vomiting, and no dizziness. No gross motor or sensory function deficits.   Pt states she has not had ha before in the past  Family member thinks pt is stressed out.  Also some rt shoulder pain as well since Sunday. No fall or trauma. Mild pain on rom.     Review of Systems  Constitutional: Negative for fever, chills, diaphoresis, activity change and fatigue.  Eyes: Negative for pain and redness.       None presently. Only on Sunday and very brief.  Respiratory: Negative for cough, chest tightness and shortness of breath.   Cardiovascular: Negative for chest pain, palpitations and leg swelling.  Gastrointestinal: Negative for nausea, vomiting and abdominal pain.  Musculoskeletal: Negative for neck pain and neck stiffness.  Neurological: Positive for headaches. Negative for dizziness, tremors, seizures, syncope, facial asymmetry, speech difficulty, weakness, light-headedness and numbness.  Hematological: Negative for adenopathy. Does not bruise/bleed easily.  Psychiatric/Behavioral: Negative for behavioral problems, confusion and agitation. The patient is not nervous/anxious.      Past Medical History  Diagnosis Date  . Hyperlipidemia     Social History   Social History  . Marital Status: Married    Spouse Name: N/A  . Number of Children: N/A  . Years of Education: N/A   Occupational History  . Not on file.   Social History Main Topics  . Smoking status: Never Smoker   . Smokeless tobacco: Never Used  . Alcohol Use: No  . Drug Use: No  . Sexual Activity: Yes   Birth Control/ Protection: IUD     Comment: PARAGUARD.. 1st intercourse- 17, partners- 1   Other Topics Concern  . Not on file   Social History Narrative    Past Surgical History  Procedure Laterality Date  . Intrauterine device insertion  APRIL 2012    MIRENA    Family History  Problem Relation Age of Onset  . Diabetes Sister   . Hypertension Sister   . Diabetes Brother   . Diabetes Brother   . Diabetes Mother     No Known Allergies  No current outpatient prescriptions on file prior to visit.   No current facility-administered medications on file prior to visit.    BP 100/70 mmHg  Pulse 76  Temp(Src) 98.1 F (36.7 C) (Oral)  Ht 5' (1.524 m)  Wt 140 lb 9.6 oz (63.776 kg)  BMI 27.46 kg/m2  SpO2 99%  LMP 10/13/2015      Objective:   Physical Exam  General Mental Status- Alert. General Appearance- Not in acute distress.   Skin General: Color- Normal Color. Moisture- Normal Moisture.  Neck Carotid Arteries- Normal color. Moisture- Normal Moisture. No carotid bruits. No JVD. On palpation rt side trapezius is sore to palpation. No mid c-spine tenderness.  Chest and Lung Exam Auscultation: Breath Sounds:-Normal. CTA.  Cardiovascular Auscultation:Rythm- Regular, Regular, Rate and rhythm. Murmurs & Other Heart Sounds:Auscultation of the heart reveals- No Murmurs.   Neurologic Cranial Nerve exam:- CN III-XII intact(No nystagmus), symmetric smile.  Drift Test:- No drift.  Heal to Toe Gait exam:-Normal. Finger to Nose:- Normal/Intact Strength:- 5/5 equal and symmetric strength both upper and lower extremities. Temporal area both sides normal.  Rt shoulder- mild pain on palpation and rom. No crepitus.      Assessment & Plan:   For ha today gave toradol IM. I think by history and physical exam you are having possible tension ha's. I am going to give you a muscle relaxant to use sparingly mostly at night if you have ha with tender trapezius muscles.  Caution on using muscle relaxant  during the day due to sedation effect. During the day for mild ha can take diclofenac. Hold/stop otc nsaids such as alleve or ibuprofen.  If ha with neurologic signs or symptoms then ED evaluation. If ha changing or worsening consider ct of head as well as possible neurologist referral.  For your rt shoulder pain will get xray. Also will get sed rate today.  Follow up in 3 wks or as needed

## 2015-10-18 NOTE — Progress Notes (Signed)
Pre visit review using our clinic review tool, if applicable. No additional management support is needed unless otherwise documented below in the visit note. 

## 2015-10-18 NOTE — Patient Instructions (Addendum)
For ha today gave toradol IM. I think by history and physical exam you are having possible tension ha. I am going to give you a muscle relaxant to use sparingly mostly at night if you have ha with tender trapezius muscles. Caution on using muscle relaxant  during the day due to sedation effect. During the day for mild ha can take diclofenac. Hold/stop otc nsaids such as alleve or ibuprofen.  If ha with neurologic signs or symptoms then ED evaluation. If ha changing or worsening consider ct of head as well as possible neurologist referral.  For your rt shoulder pain will get xray. Also will get sed rate today.  Follow up in 3 wks or as needed

## 2015-11-08 ENCOUNTER — Ambulatory Visit (INDEPENDENT_AMBULATORY_CARE_PROVIDER_SITE_OTHER): Payer: BLUE CROSS/BLUE SHIELD | Admitting: Medical

## 2015-11-08 ENCOUNTER — Encounter (INDEPENDENT_AMBULATORY_CARE_PROVIDER_SITE_OTHER): Payer: Self-pay

## 2015-11-08 ENCOUNTER — Encounter: Payer: Self-pay | Admitting: Medical

## 2015-11-08 VITALS — BP 100/70 | HR 78 | Temp 98.2°F | Ht 60.0 in | Wt 148.0 lb

## 2015-11-08 DIAGNOSIS — R51 Headache: Secondary | ICD-10-CM | POA: Diagnosis not present

## 2015-11-08 DIAGNOSIS — M25511 Pain in right shoulder: Secondary | ICD-10-CM | POA: Diagnosis not present

## 2015-11-08 DIAGNOSIS — Z658 Other specified problems related to psychosocial circumstances: Secondary | ICD-10-CM

## 2015-11-08 DIAGNOSIS — R519 Headache, unspecified: Secondary | ICD-10-CM

## 2015-11-08 DIAGNOSIS — F439 Reaction to severe stress, unspecified: Secondary | ICD-10-CM

## 2015-11-08 NOTE — Patient Instructions (Addendum)
Your symptoms ha and shoulder pain responded very quickly to toradol.   You associated stress/tension with both conditions. You never used diclofenac or flexeril. But you may want to fill those and have on hand if needed.  Your stress is controlled now. If your get stress with anxiety or depression let us know and could give medications. But try conservative measures first exercise, prayer etc.  You could go ahead and make schedule for CPE in mid march or call back later.   Otherwise follow up as needed.

## 2015-11-08 NOTE — Progress Notes (Signed)
   Subjective:    Patient ID: Christy Taylor, female    DOB: 12-28-1971, 44 y.o.   MRN: [id number]  HPI   Pt states she has no further ha and no shoulder pain. Pt states toradol stopped headache and shoulder quickly. No recurrence since I last saw her.  Pt states that she thinks conditions is related to stress.     Review of Systems  Constitutional: Negative for fever, chills and fatigue.  Respiratory: Negative for cough, choking, shortness of breath and wheezing.   Cardiovascular: Negative for chest pain and palpitations.  Musculoskeletal: Negative for back pain.       Resolved shoulder pain.  Neurological: Negative for dizziness, weakness and headaches.       No recurrent ha.  Hematological: Negative for adenopathy. Does not bruise/bleed easily.  Psychiatric/Behavioral: Negative for sleep disturbance and dysphoric mood. The patient is not nervous/anxious.        Stress controlled.       Objective:   Physical Exam  General Mental Status- Alert. General Appearance- Not in acute distress.   Skin General: Color- Normal Color. Moisture- Normal Moisture.  Neck Carotid Arteries- Normal color. Moisture- Normal Moisture. No carotid bruits. No JVD. No trapezius tenderness  Chest and Lung Exam Auscultation: Breath Sounds:-Normal.  Cardiovascular Auscultation:Rythm- Regular. Murmurs & Other Heart Sounds:Auscultation of the heart reveals- No Murmurs.    Neurologic Cranial Nerve exam:- CN III-XII intact(No nystagmus), symmetric smile. Strength:- 5/5 equal and symmetric strength both upper and lower extremities.      Assessment & Plan:  Your symptoms ha and shoulder pain responded very quickly to toradol.   You associated stress/tension with both conditions. You never used diclofenac or flexeril. But you may want to fill those and have on hand if needed.  Your stress is controlled now. If your get stress with anxiety or depression let us know and could give  medications. But try conservative measures first exercise, prayer etc.  You could go ahead and make schedule for CPE in mid march or call back later.   Otherwise follow up as needed.

## 2015-11-08 NOTE — Progress Notes (Signed)
Pre visit review using our clinic review tool, if applicable. No additional management support is needed unless otherwise documented below in the visit note. 

## 2015-12-31 ENCOUNTER — Ambulatory Visit: Payer: BLUE CROSS/BLUE SHIELD | Admitting: Vascular Surgery

## 2016-03-20 ENCOUNTER — Encounter: Payer: BLUE CROSS/BLUE SHIELD | Admitting: Gynecology

## 2016-03-27 ENCOUNTER — Encounter: Payer: BLUE CROSS/BLUE SHIELD | Admitting: Gynecology

## 2016-04-24 DIAGNOSIS — Z1231 Encounter for screening mammogram for malignant neoplasm of breast: Secondary | ICD-10-CM | POA: Diagnosis not present

## 2016-05-01 ENCOUNTER — Encounter: Payer: Self-pay | Admitting: Gynecology

## 2016-05-06 DIAGNOSIS — N390 Urinary tract infection, site not specified: Secondary | ICD-10-CM | POA: Diagnosis not present

## 2016-05-08 ENCOUNTER — Ambulatory Visit (INDEPENDENT_AMBULATORY_CARE_PROVIDER_SITE_OTHER): Payer: BLUE CROSS/BLUE SHIELD | Admitting: Gynecology

## 2016-05-08 ENCOUNTER — Encounter: Payer: Self-pay | Admitting: Gynecology

## 2016-05-08 VITALS — BP 110/70 | Ht 61.0 in | Wt 144.0 lb

## 2016-05-08 DIAGNOSIS — Z87410 Personal history of cervical dysplasia: Secondary | ICD-10-CM

## 2016-05-08 DIAGNOSIS — Z01419 Encounter for gynecological examination (general) (routine) without abnormal findings: Secondary | ICD-10-CM

## 2016-05-08 DIAGNOSIS — N871 Moderate cervical dysplasia: Secondary | ICD-10-CM | POA: Insufficient documentation

## 2016-05-08 NOTE — Progress Notes (Signed)
Christy Taylor 1971-10-25 [id number]   History:    45 y.o.  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 Her follow-up Pap smear in 2013 was normal. Patient reports normal menstrual cycle. She had a IUD placed at the health department several years ago and she does not recall if it is the 45-year-old 10 year IUD and we'll be checking the next few days.   Past medical history,surgical history, family history and social history were all reviewed and documented in the EPIC chart.  Gynecologic History Patient's last menstrual period was 05/02/2016. Contraception: IUD Last Pap: 2013, 2016. Results were: normal Last mammogram: 2017. Results were: normal  Obstetric History OB History  Gravida Para Term Preterm AB SAB TAB Ectopic Multiple Living  3 3        3     # Outcome Date GA Lbr Len/2nd Weight Sex Delivery Anes PTL Lv  3 Para     F Vag-Spont     2 Para     M Vag-Spont     1 Para     F Vag-Spont          ROS: A ROS was performed and pertinent positives and negatives are included in the history.  GENERAL: No fevers or chills. HEENT: No change in vision, no earache, sore throat or sinus congestion. NECK: No pain or stiffness. CARDIOVASCULAR: No chest pain or pressure. No palpitations. PULMONARY: No shortness of breath, cough or wheeze. GASTROINTESTINAL: No abdominal pain, nausea, vomiting or diarrhea, melena or bright red blood per rectum. GENITOURINARY: No urinary frequency, urgency, hesitancy or dysuria. MUSCULOSKELETAL: No joint or muscle pain, no back pain, no recent trauma. DERMATOLOGIC: No rash, no itching, no lesions. ENDOCRINE: No polyuria,  polydipsia, no heat or cold intolerance. No recent change in weight. HEMATOLOGICAL: No anemia or easy bruising or bleeding. NEUROLOGIC: No headache, seizures, numbness, tingling or weakness. PSYCHIATRIC: No depression, no loss of interest in normal activity or change in sleep pattern.     Exam: chaperone present  BP 110/70 mmHg  Ht 5\' 1"  (1.549 m)  Wt 144 lb (65.318 kg)  BMI 27.22 kg/m2  LMP 05/02/2016  Body mass index is 27.22 kg/(m^2).  General appearance : Well developed well nourished female. No acute distress HEENT: Eyes: no retinal hemorrhage or exudates,  Neck supple, trachea midline, no carotid bruits, no thyroidmegaly Lungs: Clear to auscultation, no rhonchi or wheezes, or rib retractions  Heart: Regular rate and rhythm, no murmurs or gallops Breast:Examined in sitting and supine position were symmetrical in appearance, no palpable masses or tenderness,  no skin retraction, no nipple inversion, no nipple discharge, no skin discoloration, no axillary or supraclavicular lymphadenopathy Abdomen: no palpable masses or tenderness, no rebound or guarding Extremities: no edema or skin discoloration or tenderness  Pelvic:  Bartholin, Urethra, Skene Glands: Within normal limits             Vagina: No gross lesions or discharge  Cervix: No gross lesions or discharge, IUD string seen  Uterus  anteverted, normal size, shape and consistency, non-tender and mobile  Adnexa  Without masses or tenderness  Anus and perineum  normal   Rectovaginal  normal sphincter tone without palpated masses or tenderness  Hemoccult not indicated     Assessment/Plan:  45 y.o. female for annual exam will return to the office next week in a fasting state for the following screening blood work: Comprehensive metabolic panel, fasting lipid profile, TSH, CBC, and urinalysis patient's encouraged to do her monthly breast exams. She is going to check with the health department as to which type of IUD she  had in place. Patient reports normal menstrual cycles.   Ok Edwards MD, 10:03 AM 05/08/2016

## 2016-05-09 LAB — URINALYSIS W MICROSCOPIC + REFLEX CULTURE
Bacteria, UA: NONE SEEN [HPF]
Bilirubin Urine: NEGATIVE
CASTS: NONE SEEN [LPF]
Crystals: NONE SEEN [HPF]
Glucose, UA: NEGATIVE
Hgb urine dipstick: NEGATIVE
KETONES UR: NEGATIVE
Leukocytes, UA: NEGATIVE
NITRITE: NEGATIVE
PH: 8 (ref 5.0–8.0)
Protein, ur: NEGATIVE
SPECIFIC GRAVITY, URINE: 1.009 (ref 1.001–1.035)
Squamous Epithelial / LPF: NONE SEEN [HPF] (ref <=5)
WBC, UA: NONE SEEN WBC/HPF (ref <=5)
YEAST: NONE SEEN [HPF]

## 2016-05-10 LAB — URINE CULTURE
Colony Count: NO GROWTH
ORGANISM ID, BACTERIA: NO GROWTH

## 2016-05-11 ENCOUNTER — Other Ambulatory Visit: Payer: BLUE CROSS/BLUE SHIELD

## 2016-05-11 DIAGNOSIS — Z01419 Encounter for gynecological examination (general) (routine) without abnormal findings: Secondary | ICD-10-CM | POA: Diagnosis not present

## 2016-05-11 LAB — LIPID PANEL
CHOL/HDL RATIO: 3.5 ratio (ref <=5.0)
CHOLESTEROL: 168 mg/dL (ref 125–200)
HDL: 48 mg/dL (ref >=46)
LDL Cholesterol: 95 mg/dL (ref <130)
TRIGLYCERIDES: 123 mg/dL (ref <150)
VLDL: 25 mg/dL (ref <30)

## 2016-05-11 LAB — COMPREHENSIVE METABOLIC PANEL
ALT: 25 U/L (ref 6–29)
AST: 15 U/L (ref 10–30)
Albumin: 3.3 g/dL — ABNORMAL LOW (ref 3.6–5.1)
Alkaline Phosphatase: 38 U/L (ref 33–115)
BUN: 10 mg/dL (ref 7–25)
CHLORIDE: 105 mmol/L (ref 98–110)
CO2: 23 mmol/L (ref 20–31)
Calcium: 8.2 mg/dL — ABNORMAL LOW (ref 8.6–10.2)
Creat: 0.6 mg/dL (ref 0.50–1.10)
Glucose, Bld: 103 mg/dL — ABNORMAL HIGH (ref 65–99)
POTASSIUM: 4.5 mmol/L (ref 3.5–5.3)
Sodium: 136 mmol/L (ref 135–146)
TOTAL PROTEIN: 6.1 g/dL (ref 6.1–8.1)
Total Bilirubin: 0.3 mg/dL (ref 0.2–1.2)

## 2016-05-11 LAB — CBC WITH DIFFERENTIAL/PLATELET
BASOS ABS: 0 {cells}/uL (ref 0–200)
Basophils Relative: 0 %
EOS ABS: 84 {cells}/uL (ref 15–500)
Eosinophils Relative: 2 %
HCT: 39.8 % (ref 35.0–45.0)
Hemoglobin: 13.1 g/dL (ref 11.7–15.5)
LYMPHS PCT: 30 %
Lymphs Abs: 1260 cells/uL (ref 850–3900)
MCH: 30 pg (ref 27.0–33.0)
MCHC: 32.9 g/dL (ref 32.0–36.0)
MCV: 91.1 fL (ref 80.0–100.0)
MONOS PCT: 8 %
MPV: 10 fL (ref 7.5–12.5)
Monocytes Absolute: 336 cells/uL (ref 200–950)
NEUTROS ABS: 2520 {cells}/uL (ref 1500–7800)
Neutrophils Relative %: 60 %
PLATELETS: 208 10*3/uL (ref 140–400)
RBC: 4.37 MIL/uL (ref 3.80–5.10)
RDW: 14 % (ref 11.0–15.0)
WBC: 4.2 10*3/uL (ref 3.8–10.8)

## 2016-05-11 LAB — TSH: TSH: 1.66 mIU/L

## 2016-05-15 ENCOUNTER — Other Ambulatory Visit: Payer: Self-pay

## 2017-05-12 ENCOUNTER — Encounter: Payer: Self-pay | Admitting: Gynecology

## 2017-09-03 ENCOUNTER — Telehealth: Payer: Self-pay | Admitting: Medical

## 2017-09-03 NOTE — Telephone Encounter (Signed)
error 

## 2017-09-06 ENCOUNTER — Encounter: Payer: Self-pay | Admitting: Medical

## 2017-09-06 ENCOUNTER — Ambulatory Visit (INDEPENDENT_AMBULATORY_CARE_PROVIDER_SITE_OTHER): Payer: BLUE CROSS/BLUE SHIELD | Admitting: Medical

## 2017-09-06 VITALS — BP 104/65 | HR 66 | Temp 97.9°F | Resp 16 | Ht 61.0 in | Wt 149.4 lb

## 2017-09-06 DIAGNOSIS — F419 Anxiety disorder, unspecified: Secondary | ICD-10-CM

## 2017-09-06 DIAGNOSIS — Z8739 Personal history of other diseases of the musculoskeletal system and connective tissue: Secondary | ICD-10-CM | POA: Diagnosis not present

## 2017-09-06 DIAGNOSIS — G4700 Insomnia, unspecified: Secondary | ICD-10-CM

## 2017-09-06 MED ORDER — DICLOFENAC SODIUM 75 MG PO TBEC: 75.0000 mg | DELAYED_RELEASE_TABLET | Freq: Two times a day (BID) | ORAL | 0 refills | Status: DC

## 2017-09-06 MED ORDER — TRAZODONE HCL 50 MG PO TABS: 25.0000 mg | ORAL_TABLET | Freq: Every evening | ORAL | 3 refills | Status: DC | PRN

## 2017-09-06 NOTE — Progress Notes (Signed)
Subjective:    Patient ID: Christy Taylor, female    DOB: 1971/09/13, 46 y.o.   MRN: [id number]  HPI  Pt in for follow up.   She states recent high level stress about one of her daughter trying to come to [place]. Daughter needs help and Christy Taylor states a lot of help so this stresses her.  Her daughter who lives in [place] is 7 month pregnant and states needs help.  Pt has some stress, anxiety, depression about the situation.   Pt never took tablets for anxiety in the past.  3 weeks difficulty sleeping.  Pt also has history of mild joint pains that come and go. Pt used diclofenac in past and wants to have refills to use if needed    Review of Systems  Constitutional: Negative for chills, fatigue and fever.  HENT: Negative for congestion.   Respiratory: Negative for apnea and shortness of breath.   Cardiovascular: Negative for chest pain and palpitations.  Gastrointestinal: Negative for abdominal pain.  Musculoskeletal: Negative for arthralgias, gait problem, myalgias and neck stiffness.       Joint pains.  Skin: Negative for rash.  Psychiatric/Behavioral: Positive for sleep disturbance. Negative for agitation, confusion, decreased concentration and dysphoric mood. The patient is not nervous/anxious.     Past Medical History:  Diagnosis Date  . Hyperlipidemia      Social History   Social History  . Marital status: Married    Spouse name: N/A  . Number of children: N/A  . Years of education: N/A   Occupational History  . Not on file.   Social History Main Topics  . Smoking status: Never Smoker  . Smokeless tobacco: Never Used  . Alcohol use No  . Drug use: No  . Sexual activity: Yes    Birth control/ protection: IUD     Comment: PARAGUARD.. 1st intercourse- 17, partners- 1   Other Topics Concern  . Not on file   Social History Narrative  . No narrative on file    Past Surgical History:  Procedure Laterality Date  . INTRAUTERINE DEVICE INSERTION  APRIL  2012   MIRENA    Family History  Problem Relation Age of Onset  . Diabetes Sister   . Hypertension Sister   . Diabetes Brother   . Diabetes Brother   . Diabetes Mother     No Known Allergies  Current Outpatient Prescriptions on File Prior to Visit  Medication Sig Dispense Refill  . cyclobenzaprine (FLEXERIL) 5 MG tablet 1 tab po q hs as needed trapezius pain or spasm 15 tablet 0  . diclofenac (VOLTAREN) 75 MG EC tablet Take 1 tablet (75 mg total) by mouth 2 (two) times daily. 30 tablet 0  . nitrofurantoin, macrocrystal-monohydrate, (MACROBID) 100 MG capsule Take 100 mg by mouth 2 (two) times daily. Reported on 05/08/2016     No current facility-administered medications on file prior to visit.     BP 104/65   Pulse 66   Temp 97.9 F (36.6 C) (Oral)   Resp 16   Ht  (1.549 m)   Wt 149 lb 6.4 oz (67.8 kg)   SpO2 100%   BMI 28.23 kg/m       Objective:   Physical Exam   General Mental Status- Alert. General Appearance- Not in acute distress.   Skin General: Color- Normal Color. Moisture- Normal Moisture.  Neck Carotid Arteries- Normal color.   Chest and Lung Exam Auscultation: Breath Sounds:-Normal.  Cardiovascular Auscultation:Rythm- Regular. Murmurs &  Other Heart Sounds:Auscultation of the heart reveals- No Murmurs.   Neurologic Cranial Nerve exam:- CN III-XII intact(No nystagmus), symmetric smile. Strength:- 5/5 equal and symmetric strength both upper and lower extremities.     Assessment & Plan:  For your recent insomnia rx trazadone. Will see if this helps.  If your mood, or anxiety worsens then consider adding ssri, and anxiety specific med.  For hx of joint pains, will rx diclofenac to use as needed for intermittent joint pains.  Follow up in 2-3 weeks or as needed  Christy Taylor, Christy Taylor, VF CorporationPA-C

## 2017-09-06 NOTE — Patient Instructions (Signed)
For your recent insomnia rx trazadone. Will see if this helps.  If your mood, or anxiety worsens then consider adding ssri, and anxiety specific med.  For hx of joint pains, will rx diclofenac to use as needed for intermittent joint pains.  Follow up in 2-3 weeks or as needed

## 2017-10-01 ENCOUNTER — Ambulatory Visit: Payer: BLUE CROSS/BLUE SHIELD | Admitting: Medical

## 2017-10-01 DIAGNOSIS — Z0289 Encounter for other administrative examinations: Secondary | ICD-10-CM

## 2018-04-22 ENCOUNTER — Other Ambulatory Visit (HOSPITAL_COMMUNITY): Payer: Self-pay | Admitting: *Deleted

## 2018-04-22 ENCOUNTER — Inpatient Hospital Stay: Payer: BLUE CROSS/BLUE SHIELD | Attending: Obstetrics and Gynecology | Admitting: *Deleted

## 2018-04-22 ENCOUNTER — Inpatient Hospital Stay: Payer: BLUE CROSS/BLUE SHIELD

## 2018-04-22 VITALS — BP 112/80 | Ht 59.0 in | Wt 150.4 lb

## 2018-04-22 DIAGNOSIS — Z0000 Encounter for general adult medical examination without abnormal findings: Secondary | ICD-10-CM

## 2018-04-22 LAB — LIPID PANEL
Cholesterol: 193 mg/dL (ref 0–200)
HDL: 45 mg/dL (ref >40)
LDL CALC: 123 mg/dL — AB (ref 0–99)
TRIGLYCERIDES: 123 mg/dL (ref <150)
Total CHOL/HDL Ratio: 4.3 RATIO
VLDL: 25 mg/dL (ref 0–40)

## 2018-04-22 LAB — HEMOGLOBIN A1C
HEMOGLOBIN A1C: 5.6 % (ref 4.8–5.6)
Mean Plasma Glucose: 114.02 mg/dL

## 2018-04-22 NOTE — Addendum Note (Signed)
Addended by: Gabriell Daigneault R on: 04/22/2018 01:53 PM   Modules accepted: Orders  

## 2018-04-22 NOTE — Patient Instructions (Addendum)
Health coaching:   Patient states she wants to  increase fruit and vegetable intake and exercise more and decrease sugary beverage  intake.  I encouraged her to continue with current exercise regimen and increase vegetable , fruit intake and to drink more water.

## 2018-04-22 NOTE — Addendum Note (Signed)
Addended by: Alysia PennaPROPER, Adia Crammer R on: 04/22/2018 01:53 PM   Modules accepted: Orders

## 2018-04-22 NOTE — Progress Notes (Signed)
[facility] initial screening    Clinical Measurement:  Height: 59in.    Weight:  150.4lb  Blood Pressure: 118/78 Blood Pressure #2:  112/80  Fasting Labs Drawn Today, will review with patient when they result.  Spanish interpreter- Delorise RoyalsJulie Sowell  Medications:  Patients states she is not taking any medications for high cholesterol, high blood pressure or diabetes.  She is not taking aspirin daily to prevent heart attack or stroke.    Blood pressure, self measurement:  Patients states she does not measure blood pressure at home.     Nutrition:  Patient states she eats 1 cup of fruit and 1 cup of vegetables in an average day.  Patient states she does not eat fish regularly, she eats about  half a serving of whole grains daily. She drinks more than 36 ounces of beverages with added sugar weekly.  She is currently watching her sodium intake.  She has not had any drinks containing alcohol in the last seven days.    Physical activity:  Patient states that she gets 210 minutes of moderate exercise in a week.  She gets 0 minutes of vigorous exercise per week.    Smoking status:  Patient states she has never smoked and is not around any smokers.    Quality of life:  Patient states that she has had 0 bad physical days out of the last 30 days. In the last 2 weeks, she has had 0 days that she has felt down or depressed. She has had 0 days in the last 2 weeks that she has had little interest or pleasure in doing things.  Risk reduction and counseling:  Patient states she wants to  increase fruit and vegetable intake and exercise more and decrease sugar intake.  I encouraged her to continue with current exercise regimen and increase vegetable and fruit intake.

## 2018-04-28 ENCOUNTER — Other Ambulatory Visit: Payer: Self-pay

## 2018-04-28 ENCOUNTER — Ambulatory Visit (INDEPENDENT_AMBULATORY_CARE_PROVIDER_SITE_OTHER): Payer: Self-pay | Admitting: Internal Medicine

## 2018-04-28 VITALS — BP 113/62 | HR 61 | Temp 98.2°F | Wt 145.9 lb

## 2018-04-28 DIAGNOSIS — I83813 Varicose veins of bilateral lower extremities with pain: Secondary | ICD-10-CM

## 2018-04-28 NOTE — Patient Instructions (Addendum)
Tiene venas varicosas que son venas que permiten que la sangre se acumule en ellas y pueden causar dolor al estar de pie por muchas horas. Elevar las piernas cuando est sentado o descansando puede ayudar.  Su estudio previo de venas no mostr una enfermedad grave en las piernas, por lo que el tratamiento est dirigido a los sntomas.  Recomiendo medias de compresin. Puede obtenerlos en una tienda de suministros mdicos con un objetivo de compresin de 20 cm H2O. Si no son asequibles, puede probar [employer] de soporte de [place], que es menos fuerte pero a menudo tambin es til. Si las piernas son dolorosas, puede intentar usar aspirina de dosis baja como antiinflamatorio.

## 2018-04-28 NOTE — Progress Notes (Signed)
   CC: Varicose veins  HPI:  ChristyChristy Taylor is a 47 y.o. female with PMHx detailed below presenting for evaluation of her varicose veins.  She has had left greater than right venous varicosities ever since her last pregnancy approximately 14 years ago.  These have recently become much more visibly pronounced bothering her with aching discomfort in her legs if she is on her feet most of the day.  She tries elevating her legs when seated which is partially helpful.  Of note she previously underwent ultrasound study for venous insufficiency with Dr. Nicky Pugh office in 2016 that showed mild to moderate venous reflux.  She does not notice much visible leg swelling is never had any ulceration or discoloration.  See problem based assessment and plan below for additional details.  Varicose veins of legs Christy Taylor has varicose veins with mild severity based on current symptom severity and 2016 study for chronic venous insufficiency.  At this time her best therapy to start will be thigh-high compression stockings at a pressure of 20 to 30 cm H2O. Procedural intervention at this time would only be elective unless symptom severity progresses significantly. Plan: Recommended medical supply fitting for 20 cm H2O thigh-high compression stockings bilaterally If not affordable or tolerable she can try off-the-shelf compression hose Can follow-up as needed if she has more trouble with symptoms   Past Medical History:  Diagnosis Date  . Hyperlipidemia     Review of Systems: Review of Systems  Constitutional: Negative for fever.  Cardiovascular: Negative for leg swelling.  Skin: Negative for rash.  Neurological: Negative for sensory change.  Endo/Heme/Allergies: Does not bruise/bleed easily.     Physical Exam: Vitals:   04/28/18 1342  BP: 113/62  Pulse: 61  Temp: 98.2 F (36.8 C)  TempSrc: Oral  SpO2: 98%  Weight: 145 lb 14.4 oz (66.2 kg)   GENERAL- alert, co-operative, NAD CARDIAC- RRR,  no murmurs, rubs or gallops. RESP- CTAB, no wheezes or crackles. ABDOMEN- Soft, nontender, nondistended NEURO-sensation normal in feet bilaterally EXTREMITIES-tortuous distended superficial veins visible past the knee on the left side, fainter veins stopping below the knee on the right leg, no pedal edema, erythema, or skin changes SKIN- Warm, dry, No rash or lesion. PSYCH- Normal mood and affect, appropriate thought content and speech.   Assessment & Plan:   See encounters tab for problem based medical decision making.   Patient discussed with Dr. Sandre Kitty

## 2018-04-29 DIAGNOSIS — Z01419 Encounter for gynecological examination (general) (routine) without abnormal findings: Secondary | ICD-10-CM | POA: Diagnosis not present

## 2018-04-29 DIAGNOSIS — Z30431 Encounter for routine checking of intrauterine contraceptive device: Secondary | ICD-10-CM | POA: Diagnosis not present

## 2018-05-03 NOTE — Progress Notes (Signed)
Internal Medicine Clinic Attending  Case discussed with Dr. Rice  at the time of the visit.  We reviewed the resident's history and exam and pertinent patient test results.  I agree with the assessment, diagnosis, and plan of care documented in the resident's note.  Alexander N Raines, MD   

## 2018-05-03 NOTE — Assessment & Plan Note (Signed)
Christy Taylor has varicose veins with mild severity based on current symptom severity and 2016 study for chronic venous insufficiency.  At this time her best therapy to start will be thigh-high compression stockings at a pressure of 20 to 30 cm H2O. Procedural intervention at this time would only be elective unless symptom severity progresses significantly. Plan: Recommended medical supply fitting for 20 cm H2O thigh-high compression stockings bilaterally If not affordable or tolerable she can try off-the-shelf compression hose Can follow-up as needed if she has more trouble with symptoms

## 2018-05-09 ENCOUNTER — Telehealth (HOSPITAL_COMMUNITY): Payer: Self-pay | Admitting: *Deleted

## 2018-05-09 NOTE — Telephone Encounter (Signed)
Late entry  [facility]  Health Coaching #2  Interpreter:  Delorise Royals  Medications: Patient states she is not taking any medication for high blood pressure, diabetes or hyperlipidemia.  Labs: Cholesterol 193,LDL 123,Trig 123,HDL  45, patient aware and understands, hemoglobin A1c 5.6  BP self measure: N/A  Goals:  Patient states she is working towards her goals of increasing exercise and increasing fruit and vegetable consumption.  Navigation:  Patient aware of 2 more follow up health coaching/follow ups.

## 2018-05-13 DIAGNOSIS — Z1231 Encounter for screening mammogram for malignant neoplasm of breast: Secondary | ICD-10-CM | POA: Diagnosis not present

## 2018-07-22 ENCOUNTER — Encounter: Payer: BLUE CROSS/BLUE SHIELD | Admitting: Obstetrics & Gynecology

## 2018-08-12 ENCOUNTER — Telehealth (HOSPITAL_COMMUNITY): Payer: Self-pay | Admitting: *Deleted

## 2018-08-12 NOTE — Telephone Encounter (Signed)
Health coaching  3  [employer] Interpreter-  Delorise RoyalsJulie Sowell  Goals- Spoke with patient via interpreter.  Patient states she has not been following her goals because she has been in celebratory mode due her daughter coming home from [place].  She has gained weight.  I encouraged her that she can get back in order.She states she will start exercising next week and will start eating more fruits and vegetables.  Navigation:  Patient is aware of 1 more health coaching sessions and a follow up.  Time-10 minutes

## 2020-03-08 ENCOUNTER — Ambulatory Visit: Payer: Self-pay | Admitting: Medical

## 2020-03-15 ENCOUNTER — Ambulatory Visit: Payer: Self-pay | Admitting: Medical

## 2020-03-15 DIAGNOSIS — Z0289 Encounter for other administrative examinations: Secondary | ICD-10-CM

## 2020-04-12 ENCOUNTER — Ambulatory Visit: Payer: Self-pay | Admitting: Medical

## 2021-03-18 ENCOUNTER — Ambulatory Visit: Payer: Self-pay | Admitting: *Deleted

## 2021-03-18 ENCOUNTER — Other Ambulatory Visit: Payer: Self-pay

## 2021-03-18 VITALS — BP 122/80 | Wt 158.4 lb

## 2021-03-18 DIAGNOSIS — R87810 Cervical high risk human papillomavirus (HPV) DNA test positive: Secondary | ICD-10-CM

## 2021-03-18 DIAGNOSIS — R87610 Atypical squamous cells of undetermined significance on cytologic smear of cervix (ASC-US): Secondary | ICD-10-CM

## 2021-03-18 DIAGNOSIS — Z1239 Encounter for other screening for malignant neoplasm of breast: Secondary | ICD-10-CM

## 2021-03-18 NOTE — Progress Notes (Signed)
Christy Taylor is a 50 y.o. female who presents to [facility] clinic today with no complaints. Patient referred to [facility] by the [facility] due to having an abnormal Pap smear 01/31/2021 that a colposcopy is recommended for follow-up.   Pap Smear: Pap smear not completed today. Last Pap smear was 01/31/2021 at the [facility] clinic and was ASCUS with positive HPV. 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 Last Pap smear result is available in Epic.   Physical exam: Breasts Breasts symmetrical. No skin abnormalities bilateral breasts. No nipple retraction bilateral breasts. No nipple discharge bilateral breasts. No lymphadenopathy. No lumps palpated bilateral breasts. No complaints of pain or tenderness on exam.       Pelvic/Bimanual Pap is not indicated today per BCCCP guidelines.   Smoking History: Patient has never smoked.   Patient Navigation: Patient education provided. Access to services provided for patient through [employer] program. Used Spanish interpreter Natale Lay from [facility] provided.   Colorectal Cancer Screening: Per patient has never had colonoscopy completed. No complaints today.    Breast and Cervical Cancer Risk Assessment: Patient does not have family history of breast cancer, known genetic mutations, or radiation treatment to the chest before age 61. Patient has history of cervical dysplasia. Patient has no history of being immunocompromised or DES exposure in-utero.  Risk Assessment    Risk Scores      03/18/2021   Last edited by: Meryl Dare, CMA   5-year risk: 0.5 %   Lifetime risk: 4.7 %          A: [facility]CP exam without pap smear No complaints.  P: Referred patient to [facility] for a screening mammogram. Appointment scheduled Tuesday, March 18, 2021 at 1145.  Referred patient to [facility] for [facility]  Healthcare for a colposcopy to follow-up for her abnormal Pap smear. Appointment scheduled Thursday, March 27, 2021 at 0835.  Priscille Heidelberg, RN 03/18/2021 10:03 AM

## 2021-03-18 NOTE — Patient Instructions (Signed)
Explained breast self awareness with Burman Foster. Patient did not need a Pap smear today due to last Pap smear was 01/31/2021. Explained the colposcopy the recommended follow-up for her abnormal Pap smear. Referred patient to [facility] for [facility] for a colposcopy to follow-up for her abnormal Pap smear. Appointment scheduled Thursday, March 27, 2021 at 0835. Referred patient to [facility] for a screening mammogram. Appointment scheduled Tuesday, March 18, 2021 at 1145. Patient aware of appointments and will be there. Let patient know that [facility] will follow up with her within the next couple weeks with results of her mammogram by letter or phone. Jennelle Pinkstaff verbalized understanding.  Jahiem Franzoni, Kathaleen Maser, RN 10:03 AM

## 2021-03-27 ENCOUNTER — Other Ambulatory Visit (HOSPITAL_COMMUNITY)
Admission: RE | Admit: 2021-03-27 | Discharge: 2021-03-27 | Disposition: A | Discharge status: Home / Self Care | Payer: Self-pay | Source: Ambulatory Visit | Attending: Obstetrics and Gynecology | Admitting: Obstetrics and Gynecology

## 2021-03-27 ENCOUNTER — Ambulatory Visit (INDEPENDENT_AMBULATORY_CARE_PROVIDER_SITE_OTHER): Payer: Self-pay | Admitting: Obstetrics and Gynecology

## 2021-03-27 ENCOUNTER — Encounter: Payer: Self-pay | Admitting: Obstetrics and Gynecology

## 2021-03-27 ENCOUNTER — Other Ambulatory Visit: Payer: Self-pay

## 2021-03-27 VITALS — BP 126/89 | HR 68 | Wt 159.0 lb

## 2021-03-27 DIAGNOSIS — N870 Mild cervical dysplasia: Secondary | ICD-10-CM | POA: Insufficient documentation

## 2021-03-27 DIAGNOSIS — Z789 Other specified health status: Secondary | ICD-10-CM

## 2021-03-27 HISTORY — PX: COLPOSCOPY W/ BIOPSY / CURETTAGE: SUR283

## 2021-03-27 LAB — POCT PREGNANCY, URINE: Preg Test, Ur: NEGATIVE

## 2021-03-27 NOTE — Progress Notes (Signed)
error 

## 2021-03-27 NOTE — Procedures (Signed)
Colposcopy Procedure Note  Pre-operative Diagnosis:  01/2021 pap: ASCUS/HPV+, genotyping not done 2016 pap: neg and hpv neg 2013 pap: neg 09/2011 colpo (adequate): no biospies or ecc taken. Repeat pap done: LSIL, HPV+ 04/2011 colpo: cin 1 bx with neg ecc 03/2011 pap: LSIL, no hpv done  Post-operative Diagnosis: CIN 1  Procedure Details  LMP 2/4; UPT negative.    The risks (including infection, bleeding, pain) and benefits of the procedure were explained to the patient and written informed consent was obtained.  The patient was placed in the dorsal lithotomy position. A Graves was speculum inserted in the vagina, and the cervix was visualized; white IUD strings seen coming from external os, approximately 3-4cm.  AA staining done Lugol's with green filter.  Biopsy from 12 o'clock and then single toothed tenaculum applied and ECC in all four quadrants done. No bleeding after procedure.  Findings: AWE changes circumferentially, mild erythema from 11 to 2 o'clock  Adequate: Yes  Specimens: 12 o'clock cervix and endocervical curettage  Condition: Stable  Complications: None  Plan: The patient was advised to call for any fever or for prolonged or severe pain or bleeding. She was advised to use OTC analgesics as needed for mild to moderate pain. She was advised to do pelvic rest for one week.  I told her to confirm her # with the front desk and will call her with results but most likely will recommend repeat pap and HPV testing in one year  Patient due to get paragard changed out in about a month with the HD. Patient denies any climateric s/s and LMP 2/4 and prior to that was in January. I d/w her re: peri-menopausal s/s and what is considered AUB and 1 year w/o a period is considered menopause; pt told to call us with any bleeding that is constant or any questions.   Interpreter used   [place] Bing, Montez Hageman MD Attending Center for [employer] [facility])

## 2021-03-31 LAB — SURGICAL PATHOLOGY

## 2021-04-02 ENCOUNTER — Telehealth: Payer: Self-pay | Admitting: Lactation Services

## 2021-04-02 NOTE — Telephone Encounter (Signed)
Called patient back on the home phone number, she is not there. Was reported that patient has been trying to call the office back. Patient will be off after 4 pm. Will need a call back.

## 2021-04-02 NOTE — Telephone Encounter (Signed)
-----   Message from [place] Bing, MD sent at 04/02/2021 10:35 AM EDT ----- Please let her know that her biopsy is the same as her pap, so slightly abnormal. She will just need to repeat her pap with B[facility]CP in one year. thanks

## 2021-04-02 NOTE — Telephone Encounter (Signed)
Called patient with assistance of [employer], Research officer, trade union. Was given the phone number of [phone] to call patient. Patient did not answer the phone. Called the home phone number back and LM to have patient to call the office at [phone] for results.

## 2021-04-03 NOTE — Telephone Encounter (Signed)
Called pt with interpreter Eda Royal. Spoke with patient's daughter who gives new phone number for patient. Contact information updated. Called new number; VM left stating a letter will be mailed to patient. MyChart message and letter sent.

## 2021-04-14 ENCOUNTER — Other Ambulatory Visit: Payer: Self-pay

## 2021-04-14 ENCOUNTER — Inpatient Hospital Stay: Payer: Self-pay | Attending: Obstetrics and Gynecology | Admitting: *Deleted

## 2021-04-14 VITALS — BP 114/82 | Ht 59.0 in | Wt 160.8 lb

## 2021-04-14 DIAGNOSIS — Z0000 Encounter for general adult medical examination without abnormal findings: Secondary | ICD-10-CM

## 2021-04-14 NOTE — Progress Notes (Signed)
Wisewoman initial screening   Interpreter- Natale Lay, [place]   Clinical Measurement:  Vitals:   04/14/21 0828  BP: 116/82   Fasting Labs Drawn Today, will review with patient when they result.   Medical History:  Patient states that she does not know if she has high cholesterol, does not have high blood pressure and she does not have diabetes.  Medications:  Patient states that she does not take medication to lower cholesterol, blood pressure or blood sugar.  Patient does not take an aspirin a day to help prevent a heart attack or stroke.    Blood pressure, self measurement: Patient states that she does not measure blood pressure from home. She checks her blood pressure N/A. She shares her readings with a health care provider: N/A.   Nutrition: Patient states that on average she eats 1 cups of fruit and 0 cups of vegetables per day. Patient states that she does not eat fish at least 2 times per week. Patient eats less than half servings of whole grains. Patient drinks less than 36 ounces of beverages with added sugar weekly: yes. Patient is currently watching sodium or salt intake: yes. In the past 7 days patient has consumed drinks containing alcohol on 0 days. On a day that patient consumes drinks containing alcohol on average 0 drinks are consumed.      Physical activity:  Patient states that she gets 0 minutes of moderate and 0 minutes of vigorous physical activity each week.  Smoking status:  Patient states that she has has never smoked .   Quality of life:  Over the past 2 weeks patient states that she had little interest or pleasure in doing things: several days. She has been feeling down, depressed or hopeless:several days.   Risk reduction and counseling:   Health Coaching: Spoke with patient about the daily recommendations for fruits and vegetables. Showed patient what a serving would look like for fruits and vegetables. Patient currently consumed tilapia once a week. Gave  examples of different heart healthy fish that patient can try adding into weekly diet (salmon, tuna, mackerel, sardines or sea bass). Patient currently does not consume any whole grains. Explained to patient what whole grains are and gave different options for whole grains she can try adding into diet (whole wheat bread or pasta, brown rice, whole grain cereals and oatmeal). Encouraged patient to try and start exercising for at least 20 minutes a day.    Navigation:  I will notify patient of lab results.  Patient is aware of 2 more health coaching sessions and a follow up. Gave patient Pharmacist, [facility] for [facility] services offered in [place].  Time: 25 minutes

## 2021-04-15 LAB — LIPID PANEL
Chol/HDL Ratio: 3.3 ratio (ref 0.0–4.4)
Cholesterol, Total: 164 mg/dL (ref 100–199)
HDL: 49 mg/dL (ref >39)
LDL Chol Calc (NIH): 101 mg/dL — ABNORMAL HIGH (ref 0–99)
Triglycerides: 70 mg/dL (ref 0–149)
VLDL Cholesterol Cal: 14 mg/dL (ref 5–40)

## 2021-04-15 LAB — HEMOGLOBIN A1C
Est. average glucose Bld gHb Est-mCnc: 131 mg/dL
Hgb A1c MFr Bld: 6.2 % — ABNORMAL HIGH (ref 4.8–5.6)

## 2021-04-15 LAB — GLUCOSE, RANDOM: Glucose: 110 mg/dL — ABNORMAL HIGH (ref 65–99)

## 2021-04-21 ENCOUNTER — Telehealth: Payer: Self-pay

## 2021-04-21 NOTE — Telephone Encounter (Signed)
Health coaching 2   interpreter- Natale Lay, [employer]   Labs-cholesterol , LDL cholesterol , triglycerides , HDL cholesterol , hemoglobin A1C , mean plasma glucose   Patient understands and is aware of her lab results.   Goals- Spoke with patient about lab results and answered any questions that patient had regarding results. Informed patient that her lab work did show that she is the pre-diabetic range. Spoke with patient about what this means and how her risk of developing Type 2 diabetes is greater. Patient has family hx of siblings with diabetes.  1. Reduce the amount of sweets and sugars consumed. 2. Reduce the amount of carbs consumed. Explained to patient about the daily recommendation for the grain/carb food group. Gave patient examples of what different serving sizes would look like for different carbs. 3. Start practicing diabetic diet. 4. Exercise daily for 20-30 minutes.   Navigation:  Patient is aware of 1 more health coaching sessions and a follow up. Patient is scheduled with Internal Medicine on Thursday, April 24, 2021 @ 9:45 am for follow-up for elevated labs. Referred patient to the Diabetes [facility] [place] prevention program. Informed patient that someone for [facility] [place] will reach out to her to register her for classes. Mailed patient orange card application.   Time- 18 minutes

## 2021-04-21 NOTE — Telephone Encounter (Signed)
Left message for patient about lab results from [facility] Woman via spanish interpreter Natale Lay, [place]. Left name and number for patient to call back.

## 2021-04-24 ENCOUNTER — Ambulatory Visit (INDEPENDENT_AMBULATORY_CARE_PROVIDER_SITE_OTHER): Payer: Self-pay | Admitting: Student

## 2021-04-24 DIAGNOSIS — Z0000 Encounter for general adult medical examination without abnormal findings: Secondary | ICD-10-CM

## 2021-04-24 DIAGNOSIS — R7303 Prediabetes: Secondary | ICD-10-CM

## 2021-04-24 NOTE — Patient Instructions (Addendum)
Fue un Photographer. Hoy discutimos:  Prediabetes: Su A1c estaba en el rango prediabtico, volveremos a verificar esto en aproximadamente 1 ao. Christy Taylor, trate de comer una dieta saludable y hacer ejercicio. He incluido algunos materiales educativos para ayudar con esto. Tambin comience a Materials engineer, ya que la prdida de peso y el ejercicio tambin pueden ayudar a Loss adjuster, chartered de diabetes y otros problemas de salud a Air cabin crew.  Por favor, haga un seguimiento en 6 meses a 1 ao  Si tiene alguna pregunta o inquietud, llame a nuestra clnica al [phone] [place] 9 a. m. y las 5 p. m. y despus del horario de atencin llame al [phone] y pregunte por el residente de medicina interna de [place]. Si cree que tiene Engineer, drilling, llame al 911.  Gracias, esperamos poder ayudarlo a mantenerse saludable!  Hacer ejercicio para mantenerse sano Exercising to Stay Healthy Para estar sano y Admire as, es recomendable hacer ejercicio de intensidad moderada y de intensidad vigorosa. Puede saber si est haciendo ejercicio de intensidad moderada si su corazn comienza a latir ms rpido y su respiracin se vuelve ms rpida, pero an Therapist, occupational. Puede saber que est haciendo ejercicio de intensidad vigorosa si respira con mucha ms dificultad y rapidez, y no puede Pharmacologist una conversacin. Hacer actividad fsica con regularidad es muy importante. Tiene muchos otros beneficios, como por ejemplo:  Mejora el estado fsico general, la flexibilidad y la resistencia.  Aumenta la densidad sea.  Ayuda a Art gallery manager.  Disminuye la Art gallery manager.  Aumenta la fuerza muscular.  Reduce el estrs y las tensiones.  Mejora el estado de salud general. Con qu frecuencia debera hacer ejercicio? Elija una actividad que disfrute y establezca objetivos realistas. El mdico puede ayudarlo a Event organiser un plan de actividades que funcione  para usted. Haga actividad fsica habitualmente como se lo haya indicado el mdico. Esto puede incluir lo siguiente:  Hacer ejercicios de fortalecimiento muscular dos veces a la semana, como: ? Levantamiento de pesas. ? Usar bandas elsticas de resistencia. ? Flexiones de [employer]. ? Abdominales. ? Yoga.  Realizar ejercicio de [place] cierta intensidad durante una cantidad determinada de [place]. Elija entre estas opciones: ? Un total de de ejercicio de intensidad moderada cada semana. ? Un total de de ejercicio de intensidad vigorosa cada semana. ? Burlene Arnt de ejercicio de intensidad moderada y vigorosa cada semana. Los nios, las mujeres [place], las personas que no han hecho actividad fsica con regularidad, las personas que tienen sobrepeso y los adultos mayores tal vez tengan que consultar a un mdico sobre qu actividades son seguras para Radio producer. Si tiene [employer], asegrese de Science writer al mdico antes de comenzar un programa de ejercicios nuevo. Cules son algunas ideas para hacer ejercicio? Algunas ideas de ejercicio de intensidad moderada incluyen:  Caminar 1 milla (1.6 kilmetros) en aproximadamente 15 minutos.  Andar en bicicleta.  Practicar senderismo.  Jugar al golf.  Bailar.  Gimnasia acutica. Algunas ideas de ejercicio de intensidad vigorosa incluyen:  Caminar 4.5 millas (7.2 kilmetros) o ms en aproximadamente una hora.  Trotar o correr 5 millas (8 kilmetros) en aproximadamente una hora.  Andar en bicicleta 10 millas (16,1 kilmetros) o ms en aproximadamente una hora.  Practicar natacin.  Practicar patinaje de ruedas o en lnea.  Hacer esqu de fondo.  Hacer deportes competitivos vigorosos, como ftbol americano, bsquet y ftbol.  Saltar la cuerda.  Tomar clases de baile aerbico.  Cules son algunas actividades diarias que pueden ayudarme a ejercitarme?  Trabajar en el jardn, como: ? Empujar una cortadora de  csped. ? Juntar y embolsar hojas.  Lavar el automvil.  Empujar un cochecito.  Palear nieve.  Cuidar el jardn.  Lavar las ventanas o los pisos. Cmo puedo ser ms activo en mis actividades diarias?  Utilice las Microbiologist del ascensor.  Vaya a caminar durante su hora de almuerzo.  Si conduce, estacione el automvil ms lejos del trabajo o de la escuela.  Si [place] transporte pblico, bjese una parada antes y camine el resto del camino.  Pngase de pie o camine durante todas las llamadas telefnicas que haga mientras est adentro.  Levntese, estrese y camine cada a lo largo del Futures trader.  Haga ejercicio con Leisure centre manager. El apoyo para continuar haciendo ejercicio lo ayudar a Pharmacologist una rutina de actividad frecuente. Qu pautas puedo seguir mientras hago ejercicio?  Hable con el mdico antes de comenzar un programa nuevo de [place] fsica.  No haga ejercicio en exceso que pudiera hacer que se lastime, se sienta mareado o tenga dificultad para respirar.  Use ropa cmoda y calzado con buen soporte.  Beba gran cantidad de agua mientras hace ejercicio para evitar la deshidratacin o los golpes de Airline pilot.  Haga ejercicio hasta que se aceleren su respiracin y sus latidos cardacos. Dnde encontrar ms informacin  Departamento de Salud y [address] de los [place] (U.S. Department of Health and Health and safety inspector): ThisPath.fi  Centros para Air traffic controller y Psychiatrist de Child psychotherapist for Disease Control and Prevention, CDC): FootballExhibition.com.br Resumen  Hacer actividad fsica con regularidad es muy importante. Mejorar el estado fsico general, la flexibilidad y la resistencia.  El ejercicio regular tambin mejora la salud general. [place] a controlar su peso, reducir el estrs y Scientist, clinical (histocompatibility and immunogenetics) la densidad sea.  No haga ejercicio en exceso que pudiera hacer que se lastime, se sienta mareado o tenga dificultad para respirar.  Hable con el mdico  antes de comenzar un programa nuevo de [place] fsica. Esta informacin no tiene Theme park manager el consejo del mdico. Asegrese de hacerle al mdico cualquier pregunta que tenga. Document Revised: 01/22/2018 Document Reviewed: 01/22/2018 Elsevier Patient Education  2021 [employer].

## 2021-04-25 ENCOUNTER — Encounter: Payer: Self-pay | Admitting: Student

## 2021-04-25 DIAGNOSIS — R7303 Prediabetes: Secondary | ICD-10-CM | POA: Insufficient documentation

## 2021-04-25 NOTE — Progress Notes (Signed)
   CC: follow up of elevated A1c  HPI:  Christy Taylor is a 50 y.o. female with past medical history listed below who presents for follow up of elevated A1c levels during recent Wise woman visit. Please refer to problem based charting for further details and assessment and plan of current problem and chronic medical conditions.   Past Medical History:  Diagnosis Date  . Hyperlipidemia    Review of Systems:  Negative as per HPI  Physical Exam:  Vitals:   04/24/21 0857  BP: 118/69  Pulse: 63  Temp: 98.4 F (36.9 C)  TempSrc: Oral  SpO2: 99%  Weight: 159 lb 6.4 oz (72.3 kg)   Constitutional: Appears well-developed and well-nourished. No distress.  HENT: Normocephalic and atraumatic, moist mucous membranes Cardiovascular: Normal rate, regular rhythm. Distal pulses intact Respiratory: No respiratory distress, no accessory muscle use.  Effort is normal.  Lungs are clear to auscultation bilaterally. GI: Nondistended, soft, nontender to palpation, normal active bowel sounds Musculoskeletal: Normal bulk and tone.  No peripheral edema noted. Neurological: Is alert and oriented x4, no apparent focal deficits noted. Skin: Warm and dry.  No rash, erythema, lesions noted. Psychiatric: Normal mood and affect.   Assessment & Plan:   See Encounters Tab for problem based charting.  Patient discussed with Dr. Oswaldo Done

## 2021-04-25 NOTE — Assessment & Plan Note (Signed)
Patient presents for elevated labs on recent Wise woman visit. A1c was 6.2%. Discussed this puts her in the prediabetic range. Discussed lifestyle modifications including decreasing dietary sugars, increasing exercise and, and weight loss to help prevent progression to diabetes. Patient notes she rarely has sugary beverages but has about 6 tortillas a day and frequently has white rice with meals and has been trying to reduce portion sizes. Provided her with meal planning guides to help her with diet modifications.  She is also planning on going to exercise at the gym with her daughter every day. Encouraged her continue with this. Provided her with meal planning guides to help her with diet modifications. Discussed that we will repeat A1c in about 1 year to follow up on this.

## 2021-04-25 NOTE — Progress Notes (Signed)
Internal Medicine Clinic Attending ? ?Case discussed with Dr. Liang  At the time of the visit.  We reviewed the resident?s history and exam and pertinent patient test results.  I agree with the assessment, diagnosis, and plan of care documented in the resident?s note. ? ?

## 2021-04-25 NOTE — Assessment & Plan Note (Addendum)
Lipid Panel     Component Value Date/Time   CHOL 164 04/14/2021 0924   TRIG 70 04/14/2021 0924   HDL 49 04/14/2021 0924   CHOLHDL 3.3 04/14/2021 0924   CHOLHDL 4.3 04/22/2018 1353   VLDL 25 04/22/2018 1353   LDLCALC 101 (H) 04/14/2021 0924   LABVLDL 14 04/14/2021 0924   Lipid panel with elevated LDL of 101. ASCVD risk of 0.9%. Encouraged her to continue with lifestyle modifications including regular exercise and diet changes to help with reducing cholesterol levels.

## 2021-07-01 ENCOUNTER — Emergency Department (HOSPITAL_COMMUNITY): Payer: No Typology Code available for payment source

## 2021-07-01 ENCOUNTER — Other Ambulatory Visit: Payer: Self-pay

## 2021-07-01 ENCOUNTER — Encounter (HOSPITAL_COMMUNITY): Payer: Self-pay

## 2021-07-01 ENCOUNTER — Emergency Department (HOSPITAL_COMMUNITY)
Admission: EM | Admit: 2021-07-01 | Discharge: 2021-07-02 | Disposition: A | Discharge status: Home / Self Care | Payer: No Typology Code available for payment source | Attending: Emergency Medicine | Admitting: Emergency Medicine

## 2021-07-01 DIAGNOSIS — Z23 Encounter for immunization: Secondary | ICD-10-CM | POA: Diagnosis not present

## 2021-07-01 DIAGNOSIS — S79922A Unspecified injury of left thigh, initial encounter: Secondary | ICD-10-CM | POA: Diagnosis present

## 2021-07-01 DIAGNOSIS — S7012XA Contusion of left thigh, initial encounter: Secondary | ICD-10-CM | POA: Diagnosis not present

## 2021-07-01 DIAGNOSIS — S8002XA Contusion of left knee, initial encounter: Secondary | ICD-10-CM | POA: Diagnosis not present

## 2021-07-01 DIAGNOSIS — R079 Chest pain, unspecified: Secondary | ICD-10-CM | POA: Insufficient documentation

## 2021-07-01 DIAGNOSIS — Y92410 Unspecified street and highway as the place of occurrence of the external cause: Secondary | ICD-10-CM | POA: Diagnosis not present

## 2021-07-01 DIAGNOSIS — M79642 Pain in left hand: Secondary | ICD-10-CM | POA: Insufficient documentation

## 2021-07-01 DIAGNOSIS — M545 Low back pain, unspecified: Secondary | ICD-10-CM | POA: Insufficient documentation

## 2021-07-01 DIAGNOSIS — S8001XA Contusion of right knee, initial encounter: Secondary | ICD-10-CM | POA: Diagnosis not present

## 2021-07-01 DIAGNOSIS — Y900 Blood alcohol level of less than 20 mg/100 ml: Secondary | ICD-10-CM | POA: Insufficient documentation

## 2021-07-01 DIAGNOSIS — T07XXXA Unspecified multiple injuries, initial encounter: Secondary | ICD-10-CM

## 2021-07-01 DIAGNOSIS — S301XXA Contusion of abdominal wall, initial encounter: Secondary | ICD-10-CM | POA: Insufficient documentation

## 2021-07-01 DIAGNOSIS — I6522 Occlusion and stenosis of left carotid artery: Secondary | ICD-10-CM | POA: Diagnosis not present

## 2021-07-01 DIAGNOSIS — R911 Solitary pulmonary nodule: Secondary | ICD-10-CM | POA: Diagnosis not present

## 2021-07-01 LAB — CBC
HCT: 39.1 % (ref 36.0–46.0)
Hemoglobin: 12.8 g/dL (ref 12.0–15.0)
MCH: 27.3 pg (ref 26.0–34.0)
MCHC: 32.7 g/dL (ref 30.0–36.0)
MCV: 83.4 fL (ref 80.0–100.0)
Platelets: 219 10*3/uL (ref 150–400)
RBC: 4.69 MIL/uL (ref 3.87–5.11)
RDW: 13.5 % (ref 11.5–15.5)
WBC: 10.3 10*3/uL (ref 4.0–10.5)
nRBC: 0 % (ref 0.0–0.2)

## 2021-07-01 LAB — COMPREHENSIVE METABOLIC PANEL
ALT: 40 U/L (ref 0–44)
AST: 28 U/L (ref 15–41)
Albumin: 3.4 g/dL — ABNORMAL LOW (ref 3.5–5.0)
Alkaline Phosphatase: 53 U/L (ref 38–126)
Anion gap: 8 (ref 5–15)
BUN: 12 mg/dL (ref 6–20)
CO2: 23 mmol/L (ref 22–32)
Calcium: 8.8 mg/dL — ABNORMAL LOW (ref 8.9–10.3)
Chloride: 104 mmol/L (ref 98–111)
Creatinine, Ser: 0.53 mg/dL (ref 0.44–1.00)
GFR, Estimated: >60 mL/min (ref >60)
Glucose, Bld: 129 mg/dL — ABNORMAL HIGH (ref 70–99)
Potassium: 3.7 mmol/L (ref 3.5–5.1)
Sodium: 135 mmol/L (ref 135–145)
Total Bilirubin: 0.6 mg/dL (ref 0.3–1.2)
Total Protein: 7.1 g/dL (ref 6.5–8.1)

## 2021-07-01 LAB — PROTIME-INR
INR: 0.9 (ref 0.8–1.2)
Prothrombin Time: 12.3 seconds (ref 11.4–15.2)

## 2021-07-01 MED ORDER — MORPHINE SULFATE (PF) 4 MG/ML IV SOLN
4.0000 mg | Freq: Once | INTRAVENOUS | Status: AC
  Administered 2021-07-02: 4 mg via INTRAVENOUS
  Filled 2021-07-01: qty 1

## 2021-07-01 MED ORDER — SODIUM CHLORIDE 0.9 % IV BOLUS
500.0000 mL | Freq: Once | INTRAVENOUS | Status: AC
  Administered 2021-07-02: 500 mL via INTRAVENOUS

## 2021-07-01 MED ORDER — TETANUS-DIPHTH-ACELL PERTUSSIS 5-2.5-18.5 LF-MCG/0.5 IM SUSY
0.5000 mL | PREFILLED_SYRINGE | Freq: Once | INTRAMUSCULAR | Status: AC
  Administered 2021-07-02: 0.5 mL via INTRAMUSCULAR
  Filled 2021-07-01: qty 0.5

## 2021-07-01 MED ORDER — SODIUM CHLORIDE (PF) 0.9 % IJ SOLN
INTRAMUSCULAR | Status: AC
  Filled 2021-07-01: qty 50

## 2021-07-01 MED ORDER — ONDANSETRON HCL 4 MG/2ML IJ SOLN
4.0000 mg | Freq: Once | INTRAMUSCULAR | Status: AC
  Administered 2021-07-02: 4 mg via INTRAVENOUS
  Filled 2021-07-01: qty 2
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
## 2021-07-01 NOTE — ED Provider Notes (Signed)
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

## 2021-07-02 ENCOUNTER — Emergency Department (HOSPITAL_COMMUNITY): Payer: No Typology Code available for payment source

## 2021-07-02 ENCOUNTER — Encounter (HOSPITAL_COMMUNITY): Payer: Self-pay

## 2021-07-02 LAB — I-STAT CHEM 8, ED
BUN: 8 mg/dL (ref 6–20)
Calcium, Ion: 1.12 mmol/L — ABNORMAL LOW (ref 1.15–1.40)
Chloride: 106 mmol/L (ref 98–111)
Creatinine, Ser: 0.3 mg/dL — ABNORMAL LOW (ref 0.44–1.00)
Glucose, Bld: 109 mg/dL — ABNORMAL HIGH (ref 70–99)
HCT: 35 % — ABNORMAL LOW (ref 36.0–46.0)
Hemoglobin: 11.9 g/dL — ABNORMAL LOW (ref 12.0–15.0)
Potassium: 3.6 mmol/L (ref 3.5–5.1)
Sodium: 140 mmol/L (ref 135–145)
TCO2: 23 mmol/L (ref 22–32)

## 2021-07-02 LAB — LACTIC ACID, PLASMA: Lactic Acid, Venous: 1.1 mmol/L (ref 0.5–1.9)

## 2021-07-02 LAB — SAMPLE TO BLOOD BANK

## 2021-07-02 LAB — ETHANOL: Alcohol, Ethyl (B): <10 mg/dL (ref <10)

## 2021-07-02 LAB — TYPE AND SCREEN
ABO/RH(D): A POS
Antibody Screen: NEGATIVE

## 2021-07-02 MED ORDER — DICLOFENAC SODIUM 1 % EX GEL: 2.0000 g | Freq: Four times a day (QID) | CUTANEOUS | 0 refills | Status: DC

## 2021-07-02 MED ORDER — HYDROCODONE-ACETAMINOPHEN 5-325 MG PO TABS: 1.0000 | ORAL_TABLET | ORAL | 0 refills | Status: DC | PRN

## 2021-07-02 MED ORDER — NAPROXEN 500 MG PO TABS: 500.0000 mg | ORAL_TABLET | Freq: Two times a day (BID) | ORAL | 0 refills | Status: DC

## 2021-07-02 MED ORDER — IOHEXOL 350 MG/ML SOLN
100.0000 mL | Freq: Once | INTRAVENOUS | Status: AC | PRN
  Administered 2021-07-02: 100 mL via INTRAVENOUS

## 2021-07-02 MED ORDER — BACITRACIN ZINC 500 UNIT/GM EX OINT: 1.0000 "application " | TOPICAL_OINTMENT | Freq: Two times a day (BID) | CUTANEOUS | 0 refills | Status: DC

## 2021-07-02 MED ORDER — METHOCARBAMOL 500 MG PO TABS: 500.0000 mg | ORAL_TABLET | Freq: Two times a day (BID) | ORAL | 0 refills | Status: DC

## 2021-07-02 NOTE — Discharge Instructions (Addendum)
1. Medications: robaxin, naproxyn, bacitracin for wounds, voltaren for sore joints, vicodin for severe pain not improved with other medications, usual home medications 2. Treatment: rest, drink plenty of fluids, gentle stretching as discussed, alternate ice and heat 3. Follow Up: Please followup with your primary doctor in 3 days for discussion of your diagnoses and further evaluation after today's visit; if you do not have a primary care doctor use the resource guide provided to find one;  Return to the ER for worsening back pain, difficulty walking, loss of bowel or bladder control or other concerning symptoms

## 2022-01-28 ENCOUNTER — Telehealth: Payer: Self-pay

## 2022-01-28 NOTE — Telephone Encounter (Signed)
Attempted to contact patient about completing HC 3 for the [facility] program via [place]. Voicemail was full, unable to leave message will try back again later.

## 2022-03-09 ENCOUNTER — Ambulatory Visit: Payer: Self-pay | Admitting: Medical

## 2022-03-10 ENCOUNTER — Ambulatory Visit: Payer: Self-pay | Admitting: *Deleted

## 2022-03-10 ENCOUNTER — Ambulatory Visit: Payer: Self-pay

## 2022-03-10 ENCOUNTER — Other Ambulatory Visit: Payer: Self-pay

## 2022-03-10 VITALS — BP 120/82 | Wt 168.6 lb

## 2022-03-10 DIAGNOSIS — Z01419 Encounter for gynecological examination (general) (routine) without abnormal findings: Secondary | ICD-10-CM

## 2022-03-10 DIAGNOSIS — Z1211 Encounter for screening for malignant neoplasm of colon: Secondary | ICD-10-CM

## 2022-03-10 NOTE — Progress Notes (Signed)
[name] is a 51 y.o. G3P3 female who presents to [facility] clinic today with no complaints.  ?  ?Pap Smear: Pap smear completed today. 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 Patient has history of one other abnormal Pap smear 04/06/2011 that a colposcopy was completed for follow-up 08/04/2011 that showed CIN-I. Last Pap smear result is available in Epic. ?  ?Physical exam: ?Breasts ?Breasts symmetrical. No skin abnormalities bilateral breasts. No nipple retraction bilateral breasts. No nipple discharge bilateral breasts. No lymphadenopathy. No lumps palpated bilateral breasts. No complaints of pain or tenderness on exam. ?    ?Pelvic/Bimanual ?Ext Genitalia ?No lesions, no swelling and no discharge observed on external genitalia.      ?  ?Vagina ?Vagina pink and normal texture. No lesions and blood observed in vagina. Patient stated she has been having spotting on and off for the past 5 months. Explained to patient that it is recommended to follow up. Offered to refer her to the [facility] for [facility]. Explained BCCCP will not cover and the [facility] application. Patient stated she will follow up with her provider at the [facility] Department. Let her know if she needs any assistance scheduling or a referral to let me know.      ?  ?Cervix ?Cervix is present. Cervix pink and of normal texture. Blood observed on cervix. IUD strings visualized. ?  ?Uterus ?Uterus is present and palpable. Uterus in normal position and normal size.      ?  ?Adnexae ?Bilateral ovaries present and palpable. No tenderness on palpation.       ?  ?Rectovaginal ?No rectal exam completed today since patient had no rectal complaints. No skin abnormalities observed on exam.   ?  ?Smoking History: ?Patient has never smoked. ?  ?Patient  Navigation: ?Patient education provided. Access to services provided for patient through [place] program. Spanish interpreter Christy Taylor from [facility] provided.  ? ?Colorectal Cancer Screening: ?Per patient has never had colonoscopy completed. FIT Test given to patient to complete. No complaints today.  ?  ?Breast and Cervical Cancer Risk Assessment: ?Patient does not have family history of breast cancer, known genetic mutations, or radiation treatment to the chest before age 22. Patient has history of cervical dysplasia. Patient has no history of being immunocompromised or DES exposure in-utero. ? ?Risk Assessment   ? ? Risk Scores   ? ?   03/10/2022 03/18/2021  ? Last edited by: Christy Rutherford, LPN Christy Taylor, CMA  ? 5-year risk: 0.5 % 0.5 %  ? Lifetime risk: 4.6 % 4.7 %  ? ?  ?  ? ?  ? ? ?A: ?BCCCP exam with pap smear ?No complaints. ? ?P: ?Referred patient to [facility] for a screening mammogram. Appointment scheduled Tuesday, March 24, 2021 at 1130. ? ?Christy Heidelberg, RN ?03/10/2022 10:00 AM   ?

## 2022-03-10 NOTE — Patient Instructions (Signed)
Explained breast self awareness with Christy Taylor. Pap smear completed today. Let her know if today's Pap smear is normal and HPV negative that her next Pap smear is due in one year due to her last Pap smear was abnormal. Referred patient to [facility] for a screening mammogram. Appointment scheduled Tuesday, March 24, 2021 at 1130. Patient aware of appointment and will be there. Let patient know will follow up with her within the next couple weeks with results of Pap smear by phone. Christy Taylor verbalized understanding. ? ?Christy Taylor, Christy Chaco, RN ?10:00 AM ? ? ? ? ?

## 2022-03-12 LAB — CYTOLOGY - PAP
Comment: NEGATIVE
Diagnosis: UNDETERMINED — AB
High risk HPV: POSITIVE — AB

## 2022-03-12 NOTE — Progress Notes (Signed)
        Needs colpo

## 2022-03-18 ENCOUNTER — Telehealth: Payer: Self-pay

## 2022-03-18 NOTE — Telephone Encounter (Signed)
Via Lavon Paganini, Spanish Interpreter, Patient informed pap results, ASC-US/+HPV, per Dr. Elly Modena, needs colposcopy. Patient verbalized understanding. Referral information sent to [facility].  ?

## 2022-03-24 ENCOUNTER — Ambulatory Visit: Payer: Self-pay

## 2022-03-25 ENCOUNTER — Telehealth: Payer: Self-pay

## 2022-03-25 NOTE — Telephone Encounter (Signed)
Via Delorise Royals, Spanish Interpreter [facility]), Patient informed colposcopy appointment, May 22, 2022 @ 9:55 am, [facility].  ?

## 2022-03-31 ENCOUNTER — Ambulatory Visit (INDEPENDENT_AMBULATORY_CARE_PROVIDER_SITE_OTHER): Payer: Self-pay | Admitting: Family Medicine

## 2022-03-31 ENCOUNTER — Encounter: Payer: Self-pay | Admitting: Family Medicine

## 2022-03-31 VITALS — BP 135/81 | HR 67 | Temp 98.7°F | Resp 16 | Ht 60.0 in | Wt 168.0 lb

## 2022-03-31 DIAGNOSIS — R911 Solitary pulmonary nodule: Secondary | ICD-10-CM

## 2022-03-31 DIAGNOSIS — Z87898 Personal history of other specified conditions: Secondary | ICD-10-CM

## 2022-03-31 DIAGNOSIS — I779 Disorder of arteries and arterioles, unspecified: Secondary | ICD-10-CM

## 2022-03-31 LAB — COMPREHENSIVE METABOLIC PANEL
ALT: 47 U/L — ABNORMAL HIGH (ref 0–35)
AST: 29 U/L (ref 0–37)
Albumin: 3.6 g/dL (ref 3.5–5.2)
Alkaline Phosphatase: 45 U/L (ref 39–117)
BUN: 15 mg/dL (ref 6–23)
CO2: 27 mEq/L (ref 19–32)
Calcium: 9 mg/dL (ref 8.4–10.5)
Chloride: 107 mEq/L (ref 96–112)
Creatinine, Ser: 0.6 mg/dL (ref 0.40–1.20)
GFR: 104.46 mL/min (ref >60.00)
Glucose, Bld: 91 mg/dL (ref 70–99)
Potassium: 4.3 mEq/L (ref 3.5–5.1)
Sodium: 138 mEq/L (ref 135–145)
Total Bilirubin: 0.3 mg/dL (ref 0.2–1.2)
Total Protein: 6.6 g/dL (ref 6.0–8.3)

## 2022-03-31 LAB — CBC
HCT: 37.6 % (ref 36.0–46.0)
Hemoglobin: 12.1 g/dL (ref 12.0–15.0)
MCHC: 32.3 g/dL (ref 30.0–36.0)
MCV: 82.5 fl (ref 78.0–100.0)
Platelets: 192 10*3/uL (ref 150.0–400.0)
RBC: 4.55 Mil/uL (ref 3.87–5.11)
RDW: 16.4 % — ABNORMAL HIGH (ref 11.5–15.5)
WBC: 4.6 10*3/uL (ref 4.0–10.5)

## 2022-03-31 LAB — LIPID PANEL
Cholesterol: 177 mg/dL (ref 0–200)
HDL: 42.1 mg/dL (ref >39.00)
LDL Cholesterol: 111 mg/dL — ABNORMAL HIGH (ref 0–99)
NonHDL: 135.06
Total CHOL/HDL Ratio: 4
Triglycerides: 122 mg/dL (ref 0.0–149.0)
VLDL: 24.4 mg/dL (ref 0.0–40.0)

## 2022-03-31 LAB — HEMOGLOBIN A1C: Hgb A1c MFr Bld: 6.6 % — ABNORMAL HIGH (ref 4.6–6.5)

## 2022-03-31 NOTE — Patient Instructions (Addendum)
Updating labs today based on your history of high cholesterol, prediabetes, lung nodule, and carotid webbing.  ?Vascular referral placed regarding the carotid artery abnormality. They will call you to schedule.  ?Ordering the follow-up CT scan at your request. Please call the imaging department to get an idea of cost. Also reach out to financial assistance or see if you can apply for insurance, medicaid, etc. If so, we can move the CT scan to when you have financial coverage so it isn't so expensive.  ?
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
## 2022-04-01 ENCOUNTER — Encounter: Payer: Self-pay | Admitting: Family Medicine

## 2022-04-02 ENCOUNTER — Telehealth: Payer: Self-pay

## 2022-04-02 NOTE — Telephone Encounter (Signed)
Caller Name Bernadene Bell ?Caller Phone Number [phone] ?Patient Name Christy Taylor ?Patient DOB [birth year] ?Call Type Message Only Information Provided ?Reason for Call Request for General Office Information ?Initial Comment Caller states her mom has an appt on the 4 and she is getting a referral for her artery and was ?wondering when she should expect a call. Provider is Hyman Hopes ?Additional Comment Office hours provided. ?Disp. Time Disposition Final User ?04/02/2022 12:44:48 PM General Information Provided Yes Etta Grandchild, Chloe-Jade ?Call Closed By: Renne Musca ?Transaction Date/Time: 04/02/2022 12:41:03 PM (ET) ?

## 2022-04-07 ENCOUNTER — Ambulatory Visit (HOSPITAL_BASED_OUTPATIENT_CLINIC_OR_DEPARTMENT_OTHER)
Admission: RE | Admit: 2022-04-07 | Discharge: 2022-04-07 | Disposition: A | Discharge status: Home / Self Care | Payer: Self-pay | Source: Ambulatory Visit | Attending: Family Medicine | Admitting: Family Medicine

## 2022-04-07 DIAGNOSIS — R911 Solitary pulmonary nodule: Secondary | ICD-10-CM | POA: Insufficient documentation

## 2022-04-08 ENCOUNTER — Encounter: Payer: Self-pay | Admitting: Family Medicine

## 2022-04-09 NOTE — Telephone Encounter (Signed)
[facility] called regarding of the referral placed for the patient. She stated that the information they have is not enough for a vascular referral. She would like a call back so they can further discuss the referral. Please advise.  ? Lynford Humphrey [phone] ?

## 2022-04-28 ENCOUNTER — Other Ambulatory Visit: Payer: Self-pay

## 2022-04-28 ENCOUNTER — Telehealth: Payer: Self-pay | Admitting: Family Medicine

## 2022-04-28 NOTE — Telephone Encounter (Signed)
Patient would like for referral coordinator to contact her to discuss referral  ? ?[facility] - [phone] ?

## 2022-05-22 ENCOUNTER — Ambulatory Visit: Payer: Self-pay | Admitting: Obstetrics & Gynecology

## 2022-06-17 ENCOUNTER — Other Ambulatory Visit: Payer: Self-pay | Admitting: *Deleted

## 2022-06-17 DIAGNOSIS — R0989 Other specified symptoms and signs involving the circulatory and respiratory systems: Secondary | ICD-10-CM

## 2022-06-19 ENCOUNTER — Encounter: Payer: Self-pay | Admitting: Obstetrics & Gynecology

## 2022-06-19 ENCOUNTER — Other Ambulatory Visit (HOSPITAL_COMMUNITY)
Admission: RE | Admit: 2022-06-19 | Discharge: 2022-06-19 | Disposition: A | Discharge status: Home / Self Care | Payer: Self-pay | Source: Ambulatory Visit | Attending: Obstetrics & Gynecology | Admitting: Obstetrics & Gynecology

## 2022-06-19 ENCOUNTER — Other Ambulatory Visit: Payer: Self-pay

## 2022-06-19 ENCOUNTER — Ambulatory Visit (INDEPENDENT_AMBULATORY_CARE_PROVIDER_SITE_OTHER): Payer: Self-pay | Admitting: Obstetrics & Gynecology

## 2022-06-19 VITALS — BP 121/70 | HR 52 | Wt 160.0 lb

## 2022-06-19 DIAGNOSIS — R87810 Cervical high risk human papillomavirus (HPV) DNA test positive: Secondary | ICD-10-CM

## 2022-06-19 DIAGNOSIS — R87610 Atypical squamous cells of undetermined significance on cytologic smear of cervix (ASC-US): Secondary | ICD-10-CM

## 2022-06-19 LAB — POCT PREGNANCY, URINE: Preg Test, Ur: NEGATIVE

## 2022-06-22 LAB — SURGICAL PATHOLOGY

## 2022-06-29 ENCOUNTER — Ambulatory Visit (INDEPENDENT_AMBULATORY_CARE_PROVIDER_SITE_OTHER): Payer: Self-pay | Admitting: Surgery

## 2022-06-29 ENCOUNTER — Other Ambulatory Visit: Payer: Self-pay

## 2022-06-29 ENCOUNTER — Encounter: Payer: Self-pay | Admitting: Surgery

## 2022-06-29 ENCOUNTER — Ambulatory Visit (HOSPITAL_COMMUNITY)
Admission: RE | Admit: 2022-06-29 | Discharge: 2022-06-29 | Disposition: A | Discharge status: Home / Self Care | Payer: Self-pay | Source: Ambulatory Visit | Attending: Surgery | Admitting: Surgery

## 2022-06-29 ENCOUNTER — Ambulatory Visit: Payer: Self-pay | Admitting: Family Medicine

## 2022-06-29 VITALS — BP 130/78 | HR 54 | Temp 98.1°F | Resp 20 | Ht 60.0 in | Wt 159.0 lb

## 2022-06-29 DIAGNOSIS — I6522 Occlusion and stenosis of left carotid artery: Secondary | ICD-10-CM

## 2022-06-29 DIAGNOSIS — R0989 Other specified symptoms and signs involving the circulatory and respiratory systems: Secondary | ICD-10-CM

## 2022-06-29 DIAGNOSIS — I83813 Varicose veins of bilateral lower extremities with pain: Secondary | ICD-10-CM
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
## 2022-07-01 ENCOUNTER — Telehealth: Payer: Self-pay | Admitting: General Practice

## 2022-07-01 NOTE — Telephone Encounter (Signed)
-----   Message from Adam Phenix, MD sent at 06/30/2022  8:51 AM EDT ----- Repeat pap in one year

## 2022-07-01 NOTE — Telephone Encounter (Signed)
Called patient with Eda assisting with spanish interpretation, no answer- unable to leave message as voicemail was full.

## 2022-07-01 NOTE — Telephone Encounter (Signed)
Patient called back into front office stating she is returning our phone call. Informed patient of results and follow up recommendations. Patient verbalized understanding. Eda assisted with spanish interpretation.

## 2022-07-02 ENCOUNTER — Other Ambulatory Visit: Payer: Self-pay

## 2022-09-21 ENCOUNTER — Other Ambulatory Visit: Payer: Self-pay

## 2022-09-21 DIAGNOSIS — I6522 Occlusion and stenosis of left carotid artery: Secondary | ICD-10-CM
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
## 2022-10-05 ENCOUNTER — Ambulatory Visit (HOSPITAL_COMMUNITY)
Admission: RE | Admit: 2022-10-05 | Discharge: 2022-10-05 | Disposition: A | Discharge status: Home / Self Care | Payer: Self-pay | Source: Ambulatory Visit | Attending: Surgery | Admitting: Surgery

## 2022-10-05 DIAGNOSIS — I6522 Occlusion and stenosis of left carotid artery: Secondary | ICD-10-CM | POA: Insufficient documentation

## 2022-10-05 MED ORDER — IOHEXOL 350 MG/ML SOLN
75.0000 mL | Freq: Once | INTRAVENOUS | Status: AC | PRN
  Administered 2022-10-05: 75 mL via INTRAVENOUS

## 2022-10-12 ENCOUNTER — Ambulatory Visit: Payer: Self-pay | Admitting: Surgery

## 2022-10-12 ENCOUNTER — Ambulatory Visit (HOSPITAL_COMMUNITY): Payer: Self-pay | Attending: Surgery

## 2022-10-26 ENCOUNTER — Encounter: Payer: Self-pay | Admitting: Surgery

## 2022-10-26 ENCOUNTER — Ambulatory Visit (HOSPITAL_COMMUNITY)
Admission: RE | Admit: 2022-10-26 | Discharge: 2022-10-26 | Disposition: A | Discharge status: Home / Self Care | Payer: Self-pay | Source: Ambulatory Visit | Attending: Surgery | Admitting: Surgery

## 2022-10-26 ENCOUNTER — Ambulatory Visit (INDEPENDENT_AMBULATORY_CARE_PROVIDER_SITE_OTHER): Payer: Self-pay | Admitting: Surgery

## 2022-10-26 VITALS — BP 111/74 | HR 68 | Temp 98.1°F | Resp 20 | Ht 60.0 in | Wt 149.0 lb

## 2022-10-26 DIAGNOSIS — I83813 Varicose veins of bilateral lower extremities with pain: Secondary | ICD-10-CM
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
## 2022-11-23 ENCOUNTER — Encounter: Payer: Self-pay | Admitting: Neurology

## 2023-01-11 ENCOUNTER — Ambulatory Visit
Admission: EM | Admit: 2023-01-11 | Discharge: 2023-01-11 | Disposition: A | Discharge status: Home / Self Care | Payer: Self-pay | Attending: Urgent Care | Admitting: Urgent Care

## 2023-01-11 DIAGNOSIS — Z1152 Encounter for screening for COVID-19: Secondary | ICD-10-CM | POA: Insufficient documentation

## 2023-01-11 DIAGNOSIS — B349 Viral infection, unspecified: Secondary | ICD-10-CM | POA: Insufficient documentation

## 2023-01-11 MED ORDER — ACETAMINOPHEN 325 MG PO TABS: 650.0000 mg | ORAL_TABLET | Freq: Four times a day (QID) | ORAL | 0 refills | Status: DC | PRN

## 2023-01-11 MED ORDER — IBUPROFEN 600 MG PO TABS: 600.0000 mg | ORAL_TABLET | Freq: Four times a day (QID) | ORAL | 0 refills | Status: DC | PRN

## 2023-01-11 MED ORDER — ONDANSETRON 8 MG PO TBDP: 8.0000 mg | ORAL_TABLET | Freq: Three times a day (TID) | ORAL | 0 refills | Status: DC | PRN

## 2023-01-11 NOTE — Discharge Instructions (Signed)
Para el dolor de garganta o tos puede usar un t de miel. Use 3 cucharaditas de miel con jugo exprimido de [employer]. Coloque trozos de Pension scheme manager en 1/2-1 taza de agua y caliente sobre la estufa. Luego mezcle los ingredientes y repita cada 4 horas. Para fiebre, dolores de cuerpo tome ibuprofeno 600mg  con comida cada 6 horas alternando con o junto con Tylenol 650mg  cada 6 horas. Hidrata muy bien con al menos 2 litros (64 onzas) de agua al dia. Coma comidas ligeras como sopas para [employer] y nutricion. Tambien puede tomar suero. Comience un antihistamnico como Zyrtec (cetirizina) 10mg  al dia. Puede usar pseudoefedrina (Sudafed) de venta libre para el goteo posnasal, congestin a una dosis de 60 mg cada 8 horas o cada 12 horas. Puede usar ondansetron para la nausea.

## 2023-01-11 NOTE — ED Triage Notes (Signed)
Pt reports on Friday she started have cold chills, diarrhea, body aches, and nauseas.

## 2023-01-11 NOTE — ED Provider Notes (Signed)
[facility]  Note:  This document was prepared using Systems analyst and may include unintentional dictation errors.  MRN: [id number] DOB: 03-13-1971  Subjective:   Christy Taylor is a 52 y.o. female presenting for 2 day history of body aches, cold chills, an episode of diarrhea, nausea and 1 vomiting episode. Has used otc medication with temporary relief. No cough, chest pain, shob, wheezing, bloody stools, no recent antibiotic use, history of GI disorders, travel outside the country. Patient has been on vacation but also works at [employer].  No sick contacts to her knowledge.  No current facility-administered medications for this encounter. No current outpatient medications on file.   No Known Allergies  Past Medical History:  Diagnosis Date   Hyperlipidemia    Wellness examination 02/22/2015     Past Surgical History:  Procedure Laterality Date   COLPOSCOPY W/ BIOPSY / CURETTAGE  03/27/2021   INTRAUTERINE DEVICE INSERTION  APRIL 2012   MIRENA    Family History  Problem Relation Age of Onset   Anxiety disorder Mother    Depression Mother    Hypertension Mother    Diabetes Sister    Hypertension Sister    Diabetes Brother    Diabetes Brother    Diabetes Brother     Social History   Tobacco Use   Smoking status: Never   Smokeless tobacco: Never  Vaping Use   Vaping Use: Never used  Substance Use Topics   Alcohol use: No    Alcohol/week: 0.0 standard drinks of alcohol   Drug use: No    ROS   Objective:   Vitals: BP 127/81 (BP Location: Left Arm)   Pulse 77   Temp 98.4 F (36.9 C) (Oral)   Resp 20   LMP 04/05/2021 (Exact Date)   SpO2 96%   Physical Exam Constitutional:      General: She is not in acute distress.    Appearance: Normal appearance. She is well-developed and normal weight. She is not ill-appearing, toxic-appearing or diaphoretic.  HENT:     Head: Normocephalic and atraumatic.     Right  Ear: Tympanic membrane, ear canal and external ear normal. No drainage or tenderness. No middle ear effusion. There is no impacted cerumen. Tympanic membrane is not erythematous or bulging.     Left Ear: Tympanic membrane, ear canal and external ear normal. No drainage or tenderness.  No middle ear effusion. There is no impacted cerumen. Tympanic membrane is not erythematous or bulging.     Nose: Nose normal. No congestion or rhinorrhea.     Mouth/Throat:     Mouth: Mucous membranes are moist. No oral lesions.     Pharynx: No pharyngeal swelling, oropharyngeal exudate, posterior oropharyngeal erythema or uvula swelling.     Tonsils: No tonsillar exudate or tonsillar abscesses.  Eyes:     General: No scleral icterus.       Right eye: No discharge.        Left eye: No discharge.     Extraocular Movements: Extraocular movements intact.     Right eye: Normal extraocular motion.     Left eye: Normal extraocular motion.     Conjunctiva/sclera: Conjunctivae normal.  Cardiovascular:     Rate and Rhythm: Normal rate and regular rhythm.     Heart sounds: Normal heart sounds. No murmur heard.    No friction rub. No gallop.  Pulmonary:     Effort: Pulmonary effort is normal. No respiratory distress.  Breath sounds: No stridor. No wheezing, rhonchi or rales.  Chest:     Chest wall: No tenderness.  Abdominal:     General: Bowel sounds are increased. There is no distension.     Palpations: Abdomen is soft. There is no mass.     Tenderness: There is no abdominal tenderness. There is no right CVA tenderness, left CVA tenderness, guarding or rebound.  Musculoskeletal:     Cervical back: Normal range of motion and neck supple.  Lymphadenopathy:     Cervical: No cervical adenopathy.  Skin:    General: Skin is warm and dry.  Neurological:     General: No focal deficit present.     Mental Status: She is alert and oriented to person, place, and time.  Psychiatric:        Mood and Affect: Mood  normal.        Behavior: Behavior normal.        Thought Content: Thought content normal.        Judgment: Judgment normal.     Assessment and Plan :   PDMP not reviewed this encounter.  1. Acute viral syndrome     Deferred imaging given clear cardiopulmonary exam, hemodynamically stable vital signs.  No signs of an acute abdomen. Will manage for viral illness such as viral URI, viral syndrome, viral rhinitis, COVID-19. Recommended supportive care. Offered scripts for symptomatic relief. Testing is pending. Counseled patient on potential for adverse effects with medications prescribed/recommended today, ER and return-to-clinic precautions discussed, patient verbalized understanding.   Patient should undergo treatment for COVID with Paxlovid should she test positive.   Jaynee Eagles, [place] 01/11/23 1646

## 2023-01-12 LAB — SARS CORONAVIRUS 2 (TAT 6-24 HRS): SARS Coronavirus 2: NEGATIVE

## 2023-01-25 ENCOUNTER — Ambulatory Visit: Payer: Self-pay | Admitting: Neurology

## 2023-02-02 ENCOUNTER — Telehealth: Payer: Self-pay | Admitting: Family Medicine

## 2023-02-02 NOTE — Telephone Encounter (Signed)
Patient's daughter, Marianna Fuss, calling on behalf of mom to schedule follow up appt. Patient is requesting to transfer to Mackie Pai since he speaks [place]. Please advise if transfer is okay.

## 2023-02-17 ENCOUNTER — Encounter: Payer: Self-pay | Admitting: Medical

## 2023-02-23 ENCOUNTER — Ambulatory Visit (INDEPENDENT_AMBULATORY_CARE_PROVIDER_SITE_OTHER): Payer: Self-pay | Admitting: Medical

## 2023-02-23 VITALS — BP 120/74 | HR 74 | Temp 98.3°F | Resp 18 | Ht 61.0 in | Wt 138.0 lb

## 2023-02-23 DIAGNOSIS — E1169 Type 2 diabetes mellitus with other specified complication: Secondary | ICD-10-CM

## 2023-02-23 DIAGNOSIS — E119 Type 2 diabetes mellitus without complications: Secondary | ICD-10-CM

## 2023-02-23 DIAGNOSIS — Z1211 Encounter for screening for malignant neoplasm of colon: Secondary | ICD-10-CM

## 2023-02-23 DIAGNOSIS — E785 Hyperlipidemia, unspecified: Secondary | ICD-10-CM

## 2023-02-23 LAB — HEMOGLOBIN A1C: Hgb A1c MFr Bld: 6.1 % (ref 4.6–6.5)

## 2023-02-23 LAB — LIPID PANEL
Cholesterol: 143 mg/dL (ref 0–200)
HDL: 47.6 mg/dL (ref >39.00)
LDL Cholesterol: 83 mg/dL (ref 0–99)
NonHDL: 95.3
Total CHOL/HDL Ratio: 3
Triglycerides: 63 mg/dL (ref 0.0–149.0)
VLDL: 12.6 mg/dL (ref 0.0–40.0)

## 2023-02-23 LAB — COMPREHENSIVE METABOLIC PANEL
ALT: 28 U/L (ref 0–35)
AST: 20 U/L (ref 0–37)
Albumin: 3.3 g/dL — ABNORMAL LOW (ref 3.5–5.2)
Alkaline Phosphatase: 53 U/L (ref 39–117)
BUN: 13 mg/dL (ref 6–23)
CO2: 28 mEq/L (ref 19–32)
Calcium: 9.4 mg/dL (ref 8.4–10.5)
Chloride: 105 mEq/L (ref 96–112)
Creatinine, Ser: 0.36 mg/dL — ABNORMAL LOW (ref 0.40–1.20)
GFR: 117.41 mL/min (ref >60.00)
Glucose, Bld: 89 mg/dL (ref 70–99)
Potassium: 4 mEq/L (ref 3.5–5.1)
Sodium: 141 mEq/L (ref 135–145)
Total Bilirubin: 0.5 mg/dL (ref 0.2–1.2)
Total Protein: 6.5 g/dL (ref 6.0–8.3)

## 2023-02-23 NOTE — Patient Instructions (Addendum)
For diabetes will get A1c today and cmp. Your average blood sugar crossed level for diabetes in past but you have made dramatic diet changes so will repeat labs today to assess condition.  Mild ldl elevation in past. Repeat lipid panel today.  Follow up with gyn to repeat pap this summer as planned. Also get route repeat mammograms when ordered by your gyn.   I am placing referral to GI MD. Important to get study done.  You mentioned possibly getting study done when you visit family in [place] option. Placed referral to  [facility] as local option. Phone number [phone]. Recommend call to get scheduled if done locally.  Your wt loss appears correlated with diet change. Please keep up with above health maintenance. Try not to lose any further weight. If you continue to loose weight update me.    Follow up in 3 month or sooner if needed.

## 2023-02-23 NOTE — Progress Notes (Signed)
Subjective:    Patient ID: Christy Taylor, female    DOB: 04-13-1971, 52 y.o.   MRN: [id number]  HPI  Pt in for first time with me.  Pt works at [employer]. Pt works out 3 times a week. Eating healthy. Non smoker. No alcohol. One cup of coffee. 3 children.  Pt  has diabetes. Dx last year. When she found that out she started to eat a lot less overall and much less sugar. She also started to do intermittent fasting. Gradualy lost weight. Eats twice a day. On review wt loss started after dramatic diet change.   Pt is up to date. She states did follow up with gynecologist. Has follow visit upcoming. Pt had ascus. Told to repeat pap in one year on 06-30-2022.  Mild ldl elevation in the past. Pt is not on statin.  Pt never had colonoscopy. Pt does not have medical insurance. Sometimes she some of her medical care in [place] when she visit.    Past Medical History:  Diagnosis Date   Hyperlipidemia    Wellness examination 02/22/2015     Social History   Socioeconomic History   Marital status: Married    Spouse name: Not on file   Number of children: 3   Years of education: Not on file   Highest education level: 7th grade  Occupational History   Occupation: cook at [employer]  Tobacco Use   Smoking status: Never   Smokeless tobacco: Never  Vaping Use   Vaping Use: Never used  Substance and Sexual Activity   Alcohol use: No    Alcohol/week: 0.0 standard drinks of alcohol   Drug use: No   Sexual activity: Yes    Partners: Male    Birth control/protection: I.U.D.    Comment: PARAGUARD.. 1st intercourse- 17, partners- 1  Other Topics Concern   Not on file  Social History Narrative   Not on file   Social Determinants of Health   Financial Resource Strain: Not on file  Food Insecurity: No Food Insecurity (03/10/2022)   Hunger Vital Sign    Worried About Running Out of Food in the Last Year: Never true    Ran Out of Food in the Last Year: Never true  Transportation  Needs: No Transportation Needs (03/10/2022)   PRAPARE - Hydrologist (Medical): No    Lack of Transportation (Non-Medical): No  Physical Activity: Insufficiently Active (03/31/2022)   Exercise Vital Sign    Days of Exercise per Week: 3 days    Minutes of Exercise per Session: 30 min  Stress: Not on file  Social Connections: Not on file  Intimate Partner Violence: Not At Risk (03/31/2022)   Humiliation, Afraid, Rape, and Kick questionnaire    Fear of Current or Ex-Partner: No    Emotionally Abused: No    Physically Abused: No    Sexually Abused: No    Past Surgical History:  Procedure Laterality Date   COLPOSCOPY W/ BIOPSY / CURETTAGE  03/27/2021   INTRAUTERINE DEVICE INSERTION  APRIL 2012   MIRENA    Family History  Problem Relation Age of Onset   Anxiety disorder Mother    Depression Mother    Hypertension Mother    Diabetes Sister    Hypertension Sister    Diabetes Brother    Diabetes Brother    Diabetes Brother     No Known Allergies  Current Outpatient Medications on File Prior to Visit  Medication Sig Dispense Refill  acetaminophen (TYLENOL) 325 MG tablet Take 2 tablets (650 mg total) by mouth every 6 (six) hours as needed for moderate pain. 30 tablet 0   ibuprofen (ADVIL) 600 MG tablet Take 1 tablet (600 mg total) by mouth every 6 (six) hours as needed. 30 tablet 0   ondansetron (ZOFRAN-ODT) 8 MG disintegrating tablet Take 1 tablet (8 mg total) by mouth every 8 (eight) hours as needed for nausea or vomiting. 20 tablet 0   No current facility-administered medications on file prior to visit.    BP 120/74   Pulse 74   Temp 98.3 F (36.8 C)   Resp 18   Ht '5\' 1"'$  (1.549 m)   Wt 138 lb (62.6 kg)   LMP 04/05/2021 (Exact Date)   SpO2 100%   BMI 26.07 kg/m       Review of Systems  Constitutional:  Negative for chills, fatigue and unexpected weight change.  HENT:  Negative for congestion and drooling.   Respiratory:  Negative for  cough, chest tightness and wheezing.   Cardiovascular:  Negative for chest pain and palpitations.  Gastrointestinal:  Negative for abdominal pain, blood in stool, constipation, nausea and vomiting.  Genitourinary:  Negative for dysuria, flank pain and frequency.  Musculoskeletal:  Negative for back pain and joint swelling.  Skin:  Negative for rash.  Neurological:  Negative for weakness and numbness.  Hematological:  Negative for adenopathy.  Psychiatric/Behavioral:  Negative for behavioral problems and decreased concentration.     Past Medical History:  Diagnosis Date   Hyperlipidemia    Wellness examination 02/22/2015     Social History   Socioeconomic History   Marital status: Married    Spouse name: Not on file   Number of children: 3   Years of education: Not on file   Highest education level: 7th grade  Occupational History   Occupation: cook at [employer]  Tobacco Use   Smoking status: Never   Smokeless tobacco: Never  Vaping Use   Vaping Use: Never used  Substance and Sexual Activity   Alcohol use: No    Alcohol/week: 0.0 standard drinks of alcohol   Drug use: No   Sexual activity: Yes    Partners: Male    Birth control/protection: I.U.D.    Comment: PARAGUARD.. 1st intercourse- 17, partners- 1  Other Topics Concern   Not on file  Social History Narrative   Not on file   Social Determinants of Health   Financial Resource Strain: Not on file  Food Insecurity: No Food Insecurity (03/10/2022)   Hunger Vital Sign    Worried About Running Out of Food in the Last Year: Never true    Ran Out of Food in the Last Year: Never true  Transportation Needs: No Transportation Needs (03/10/2022)   PRAPARE - Hydrologist (Medical): No    Lack of Transportation (Non-Medical): No  Physical Activity: Insufficiently Active (03/31/2022)   Exercise Vital Sign    Days of Exercise per Week: 3 days    Minutes of Exercise per Session: 30 min  Stress:  Not on file  Social Connections: Not on file  Intimate Partner Violence: Not At Risk (03/31/2022)   Humiliation, Afraid, Rape, and Kick questionnaire    Fear of Current or Ex-Partner: No    Emotionally Abused: No    Physically Abused: No    Sexually Abused: No    Past Surgical History:  Procedure Laterality Date   COLPOSCOPY W/ BIOPSY / CURETTAGE  03/27/2021   INTRAUTERINE DEVICE INSERTION  APRIL 2012   MIRENA    Family History  Problem Relation Age of Onset   Anxiety disorder Mother    Depression Mother    Hypertension Mother    Diabetes Sister    Hypertension Sister    Diabetes Brother    Diabetes Brother    Diabetes Brother     No Known Allergies  Current Outpatient Medications on File Prior to Visit  Medication Sig Dispense Refill   acetaminophen (TYLENOL) 325 MG tablet Take 2 tablets (650 mg total) by mouth every 6 (six) hours as needed for moderate pain. 30 tablet 0   ibuprofen (ADVIL) 600 MG tablet Take 1 tablet (600 mg total) by mouth every 6 (six) hours as needed. 30 tablet 0   ondansetron (ZOFRAN-ODT) 8 MG disintegrating tablet Take 1 tablet (8 mg total) by mouth every 8 (eight) hours as needed for nausea or vomiting. 20 tablet 0   No current facility-administered medications on file prior to visit.    BP 120/74   Pulse 74   Temp 98.3 F (36.8 C)   Resp 18   Ht '5\' 1"'$  (1.549 m)   Wt 138 lb (62.6 kg)   LMP 04/05/2021 (Exact Date)   SpO2 100%   BMI 26.07 kg/m        Objective:   Physical Exam  General Mental Status- Alert. General Appearance- Not in acute distress.   Skin General: Color- Normal Color. Moisture- Normal Moisture.  Neck Carotid Arteries- Normal color. Moisture- Normal Moisture. No carotid bruits. No JVD.  Chest and Lung Exam Auscultation: Breath Sounds:-Normal.  Cardiovascular Auscultation:Rythm- Regular. Murmurs & Other Heart Sounds:Auscultation of the heart reveals- No Murmurs.  Abdomen Inspection:-Inspeection  Normal. Palpation/Percussion:Note:No mass. Palpation and Percussion of the abdomen reveal- Non Tender, Non Distended + BS, no rebound or guarding.   Neurologic Cranial Nerve exam:- CN III-XII intact(No nystagmus), symmetric smile. Strength:- 5/5 equal and symmetric strength both upper and lower extremities.       Assessment & Plan:   Patient Instructions  For diabetes will get A1c today and cmp. Your average blood sugar crossed level for diabetes in past but you have made dramatic diet changes so will repeat labs today to assess condition.  Mild ldl elevation in past. Repeat lipid panel today.  Follow up with gyn to repeat pap this summer as planned. Also get route repeat mammograms when ordered by your gyn.   I am placing referral to GI MD. Important to get study done.  You mentioned possibly getting study done when you visit family in [place] option. Placed referral to   gastroenterology as local option. Phone number [phone]. Recommend call to get scheduled if done locally.  Your wt loss appears correlated with diet change. Please keep up with above health maintenance. Try not to lose any further weight. If you continue to loose weight update me.    Follow up in 3 month or sooner if needed.     Mackie Pai, PA-C

## 2023-06-24 ENCOUNTER — Ambulatory Visit: Payer: Self-pay | Admitting: Hematology and Oncology

## 2023-06-24 VITALS — BP 107/88 | Wt 143.0 lb

## 2023-06-24 DIAGNOSIS — Z1211 Encounter for screening for malignant neoplasm of colon: Secondary | ICD-10-CM

## 2023-06-24 DIAGNOSIS — Z01419 Encounter for gynecological examination (general) (routine) without abnormal findings: Secondary | ICD-10-CM

## 2023-06-24 NOTE — Patient Instructions (Signed)
Taught Christy Taylor about self breast awareness and gave educational materials to take home. Patient did need a Pap smear today due to last Pap smear was in 03/10/22 per patient. Let her know [facility] will cover Pap smears every 5 years unless has a history of abnormal Pap smears. Referred patient to the [facility] for screening mammogram. Appointment scheduled for 03/10/22. Patient aware of appointment and will be there. Let patient know will follow up with her within the next couple weeks with results. Rubina Basinski verbalized understanding.  Pascal Lux, NP 1:33 PM

## 2023-06-24 NOTE — Progress Notes (Signed)
Ms. Breena Bevacqua is a 52 y.o. G3P3 female who presents to [facility] clinic today with no complaints.    Pap Smear: Pap smear completed today. Last Pap smear was 03/10/22 at [facility] clinic and was abnormal - ASC-US with negative HPV . Per patient has no history of an abnormal Pap smear. Last Pap smear result is available in Epic.   Physical exam: Breasts Breasts symmetrical. No skin abnormalities bilateral breasts. No nipple retraction bilateral breasts. No nipple discharge bilateral breasts. No lymphadenopathy. No lumps palpated bilateral breasts.       Pelvic/Bimanual Ext Genitalia No lesions, no swelling and no discharge observed on external genitalia.        Vagina Vagina pink and normal texture. No lesions or discharge observed in vagina.        Cervix Cervix is present. Cervix pink and of normal texture. No discharge observed.    Uterus Uterus is present and palpable. Uterus in normal position and normal size.        Adnexae Bilateral ovaries present and palpable. No tenderness on palpation.         Rectovaginal No rectal exam completed today since patient had no rectal complaints. No skin abnormalities observed on exam.     Smoking History: Patient has never smoked. Not referred to quit line.    Patient Navigation: Patient education provided. Access to services provided for patient through [facility] program. Natale Lay interpreter provided. No transportation provided   Colorectal Cancer Screening: Per patient has never had colonoscopy completed. Colorectal FIT given to complete and mail.  No complaints today.    Breast and Cervical Cancer Risk Assessment: Patient does not have family history of breast cancer, known genetic mutations, or radiation treatment to the chest before age 62. Patient does not have history of cervical dysplasia, immunocompromised, or DES exposure in-utero.  Risk Scores as of 06/24/2023     Dondra Spry           5-year 0.73 %   Lifetime 6.42 %    This patient is [place] but has no documented birth country, so the [place] model used data from [place] patients to calculate their risk score. Document a birth country in the Demographics activity for a more accurate score.         Last calculated by Caprice Red, CMA on 06/24/2023 at  1:01 PM        A: [facility]CP exam with pap smear Complaints none  P: Referred patient to the [facility] for a screening mammogram. Appointment scheduled [facility] 06/24/2023 at 230 pm.  Joette Catching, RN 06/24/2023 1:19 PM

## 2023-06-29 LAB — CYTOLOGY - PAP
Comment: NEGATIVE
Comment: NEGATIVE
Comment: NEGATIVE
Diagnosis: UNDETERMINED — AB
HPV 16: NEGATIVE
HPV 18 / 45: POSITIVE — AB
High risk HPV: POSITIVE — AB

## 2023-07-06 ENCOUNTER — Telehealth: Payer: Self-pay

## 2023-07-06 NOTE — Telephone Encounter (Signed)
-----   Message from Pascal Lux, NP sent at 06/30/2023  8:15 AM EDT ----- She will need colposcopy, please.  ----- Message ----- From: Interface, Lab In Three Zero One Sent: 06/29/2023   9:05 PM EDT To: Pascal Lux, NP

## 2023-07-06 NOTE — Telephone Encounter (Signed)
Using [address], [place], [place]: [id number], I have attempted to reach the pt. She was not available, therefore I left a message requesting she return my call. The contact number was provided and she was advised to select the option 4 to speak with a nurse.  I have tentatively scheduled the pt on 08/10/23 at 2:30p. If the pt is not able to accept this appt, she can then scheduled for 08/24/23.  Also, a staff message has been sent to my counterpart as well as IT consultant, in the event they receive the pts call.  Lastly, I have also sent the pt a MyChart message with her result information, her tentative appointment information, and the number to call back if she needs to change it.

## 2023-07-09 IMAGING — CT CT CHEST W/O CM
2 of 3 series · 15 of 36 positions shown, 18 images · non-contrast
Comparison: CT chest 07/02/2021.

CLINICAL DATA: Lung nodule, 6-8mm 6mm nodule on CT in [DATE]



[Series 2: thorax · axial · 0.72mm/px · z∈[-291,-47]mm · 12 of 144 slices shown, 15 images]
[im 11/144  mediastinal]
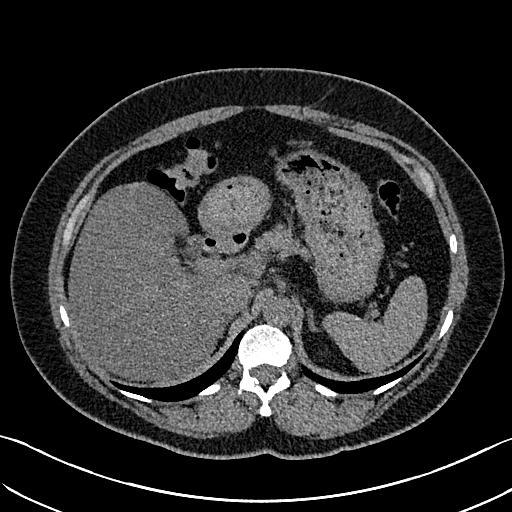
[im 11/144  lung]
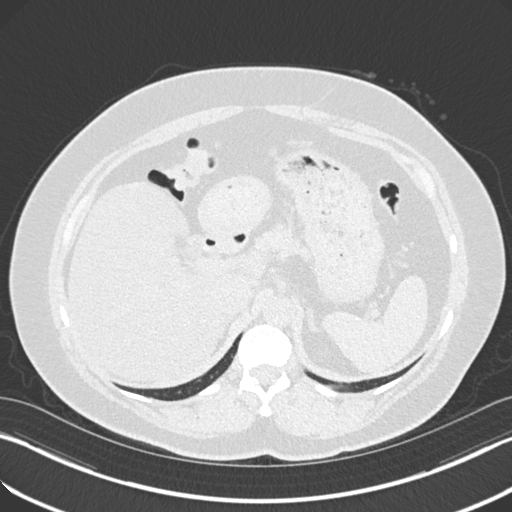
[im 22/144  lung]
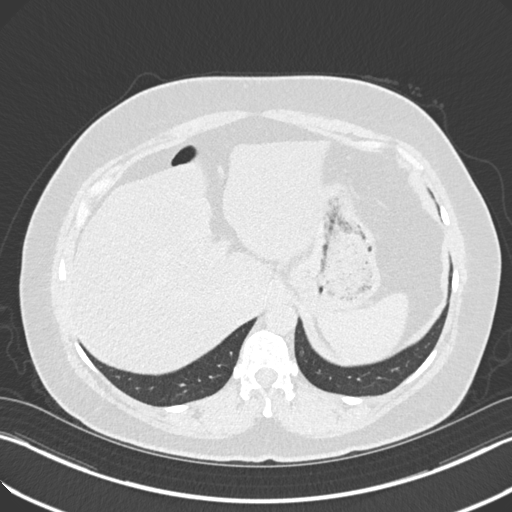
[im 32/144  lung]
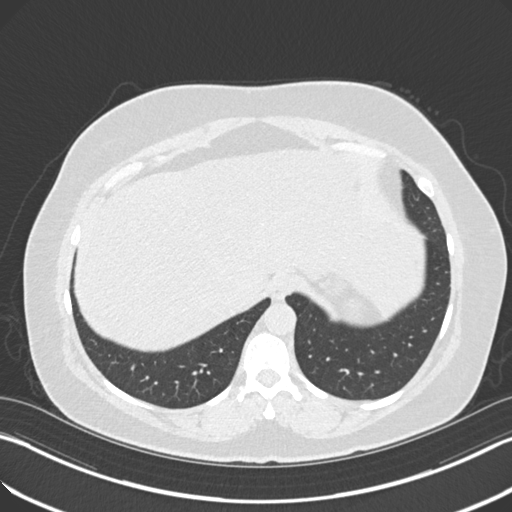
[im 43/144  lung]
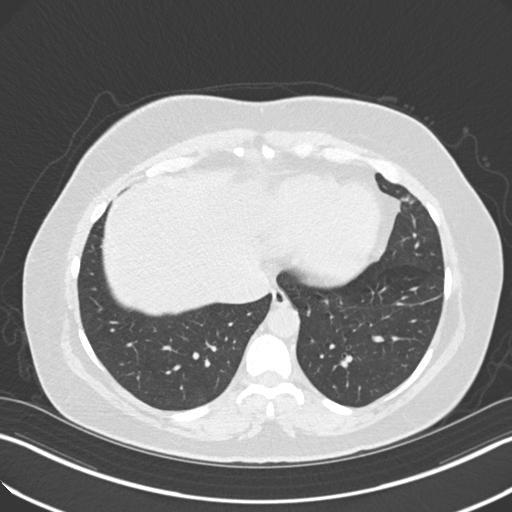
[im 53/144  mediastinal]
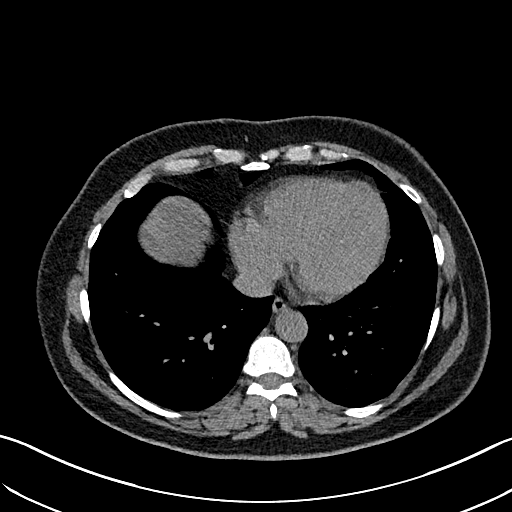
[im 53/144  lung]
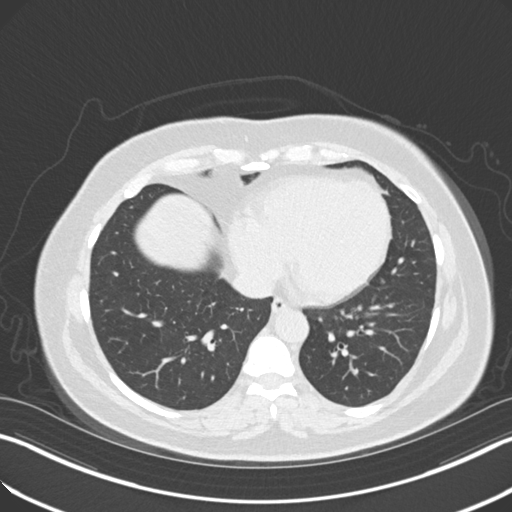
[im 64/144  lung]
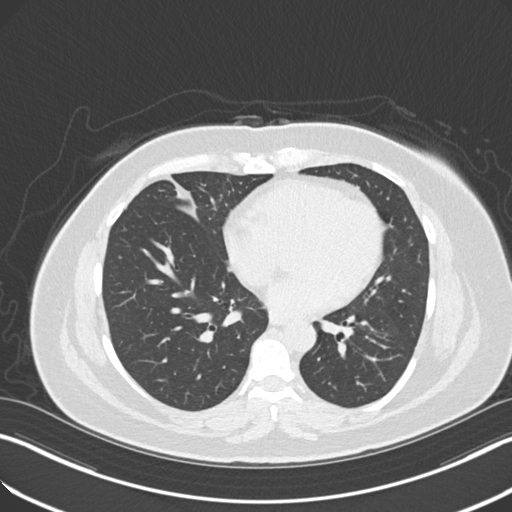
[im 80/144  lung]
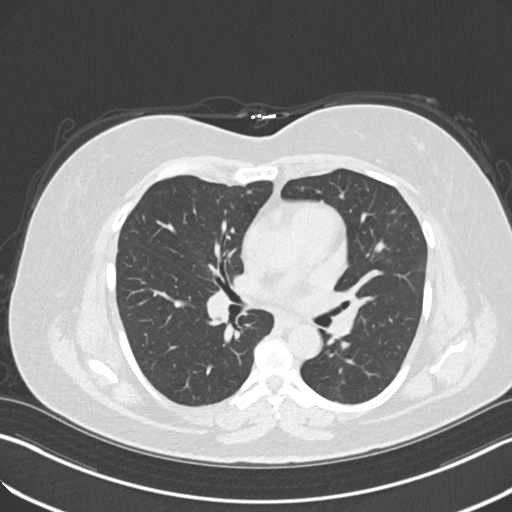
[im 91/144  lung]
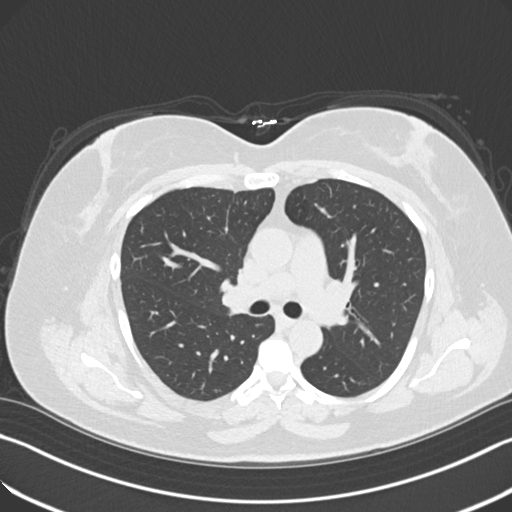
[im 101/144  mediastinal]
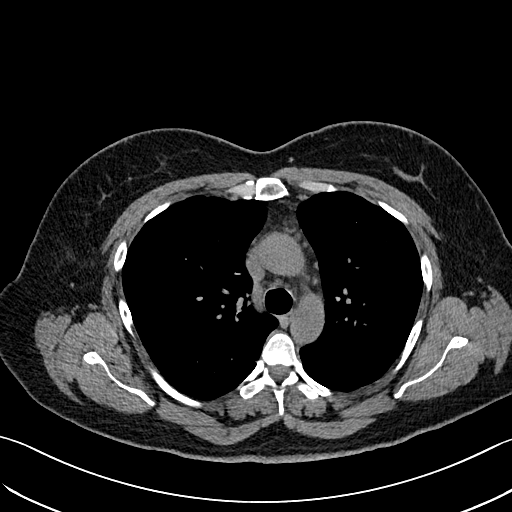
[im 101/144  lung]
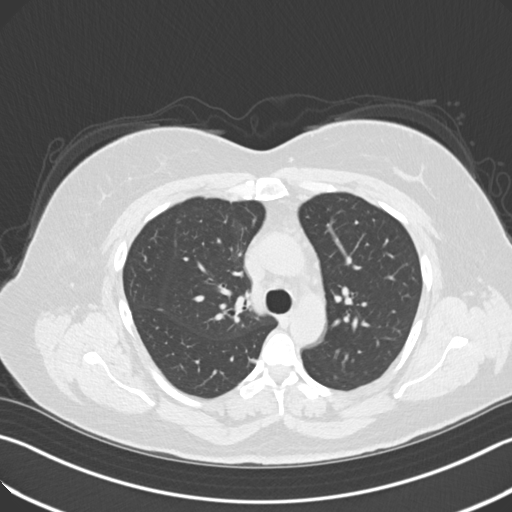
[im 112/144  lung]
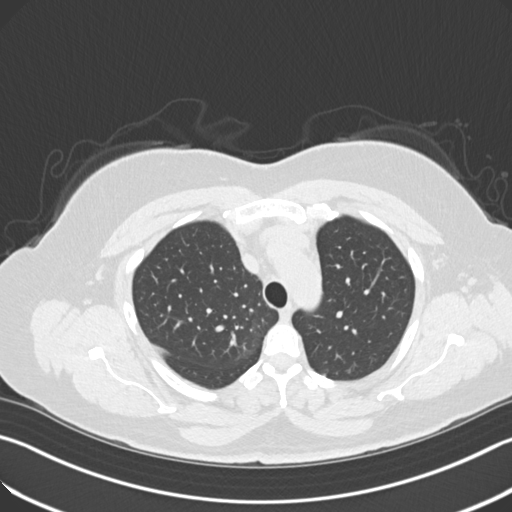
[im 122/144  lung]
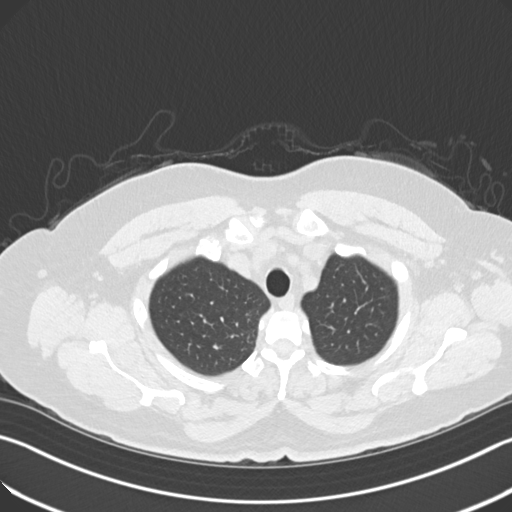
[im 133/144  lung]
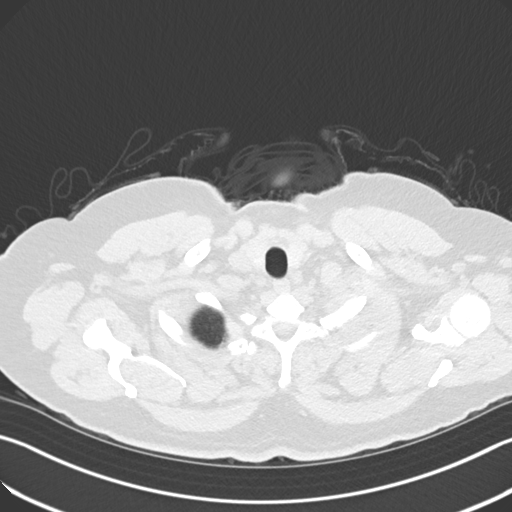

[Series 5: coronal · coronal · 0.58mm/px · 3 of 133 slices shown]
[im 27/133  lung]
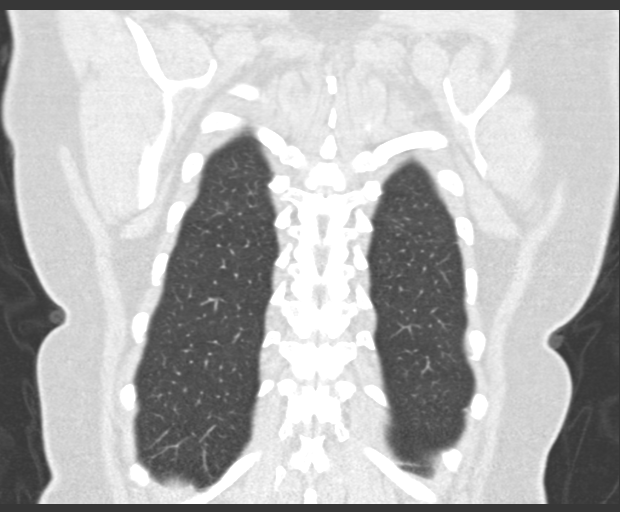
[im 53/133  lung]
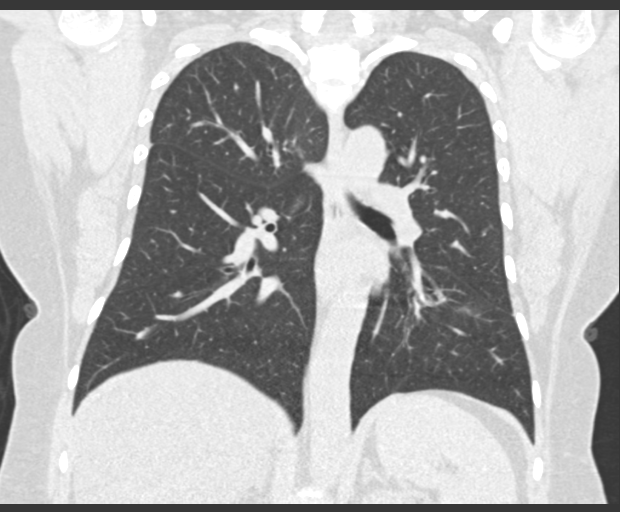
[im 80/133  lung]
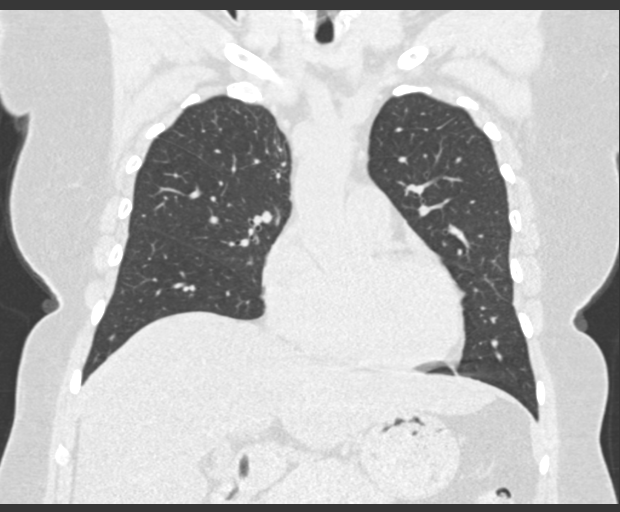

[15 of 36 positions shown; findings below may reference images not displayed]

FINDINGS: Cardiovascular: Normal heart size. No significant pericardial
effusion. The thoracic aorta is normal in caliber. No
atherosclerotic plaque of the thoracic aorta. No coronary artery
calcifications.

Mediastinum/Nodes: No gross hilar adenopathy, noting limited
sensitivity for the detection of hilar adenopathy on this
noncontrast study. Prominent but nonenlarged mediastinal lymph
nodes. No enlarged mediastinal or axillary lymph nodes. Thyroid
gland, trachea, and esophagus demonstrate no significant findings.

Lungs/Pleura: No focal consolidation. No pulmonary nodule. Stable 8
mm left lower lobe pulmonary nodule ([DATE]). Stable 4 mm left upper
lobe pulmonary nodule ([DATE]). 3 mm left lower lobe pulmonary nodule
([DATE]). Several scattered pulmonary micronodules within the right
lung stable from prior. Subpleural nodular-like density within the
lingula likely representing atelectasis. No new pulmonary mass. No
pleural effusion. No pneumothorax.

Upper Abdomen: No acute abnormality.

Musculoskeletal:

No chest wall abnormality.

No suspicious lytic or blastic osseous lesions. No acute displaced
fracture.
IMPRESSION: 1. Stable 8 mm left lower lobe pulmonary nodule. No further
follow-up indicated.
2. Stable bilateral scattered pulmonary micronodules. No further
follow-up indicated.
3. No new pulmonary nodule.

## 2023-07-12 ENCOUNTER — Telehealth: Payer: Self-pay

## 2023-07-12 NOTE — Telephone Encounter (Signed)
Called patient via Christy Taylor, [employer] to give pap smear results. Informed patient that pap smear was ASCUS and  + HPV. Based on this result a colpo is recommended for FU. Patient voiced understanding. Patient is scheduled for colpo on 08/10/23 @ 2:30 pm.

## 2023-08-10 ENCOUNTER — Other Ambulatory Visit (HOSPITAL_COMMUNITY)
Admission: RE | Admit: 2023-08-10 | Discharge: 2023-08-10 | Disposition: A | Discharge status: Home / Self Care | Payer: Medicaid Other | Source: Ambulatory Visit | Attending: Hematology and Oncology | Admitting: Hematology and Oncology

## 2023-08-10 ENCOUNTER — Ambulatory Visit: Payer: Self-pay | Admitting: Hematology and Oncology

## 2023-08-10 VITALS — Wt 143.3 lb

## 2023-08-10 DIAGNOSIS — R87610 Atypical squamous cells of undetermined significance on cytologic smear of cervix (ASC-US): Secondary | ICD-10-CM | POA: Diagnosis not present

## 2023-08-10 DIAGNOSIS — R87810 Cervical high risk human papillomavirus (HPV) DNA test positive: Secondary | ICD-10-CM | POA: Diagnosis not present

## 2023-08-10 NOTE — Progress Notes (Unsigned)
[facility] Community Outreach   GYNECOLOGY CLINIC COLPOSCOPY PROCEDURE NOTE  [name] is a 52 y.o. G3P3 here for colposcopy for ASCUS with POSITIVE high risk HPV pap smear on 06/24/23. Discussed role for HPV in cervical dysplasia, need for surveillance.  Patient given informed consent, signed copy in the chart, time out was performed.  Placed in lithotomy position. Cervix viewed with speculum and colposcope after application of acetic acid.   Colposcopy adequate? Yes  no visible lesions.  ECC specimen obtained. All specimens were labelled and sent to pathology.  Patient was given post procedure instructions.  Will follow up pathology and manage accordingly.  Routine preventative health maintenance measures emphasized.   Pascal Lux, NP 08/10/2023 3:32 PM

## 2023-08-13 ENCOUNTER — Ambulatory Visit (HOSPITAL_BASED_OUTPATIENT_CLINIC_OR_DEPARTMENT_OTHER)
Admission: RE | Admit: 2023-08-13 | Discharge: 2023-08-13 | Disposition: A | Discharge status: Home / Self Care | Payer: Self-pay | Source: Ambulatory Visit | Attending: Medical | Admitting: Medical

## 2023-08-13 ENCOUNTER — Ambulatory Visit (INDEPENDENT_AMBULATORY_CARE_PROVIDER_SITE_OTHER): Payer: Self-pay | Admitting: Medical

## 2023-08-13 VITALS — BP 116/75 | HR 76 | Ht 59.65 in | Wt 144.4 lb

## 2023-08-13 DIAGNOSIS — H02401 Unspecified ptosis of right eyelid: Secondary | ICD-10-CM

## 2023-08-13 DIAGNOSIS — R519 Headache, unspecified: Secondary | ICD-10-CM

## 2023-08-13 DIAGNOSIS — G4489 Other headache syndrome: Secondary | ICD-10-CM

## 2023-08-13 MED ORDER — TOBRAMYCIN 0.3 % OP SOLN: OPHTHALMIC | 0 refills | Status: DC

## 2023-08-13 NOTE — Progress Notes (Signed)
Subjective:    Patient ID: Christy Taylor, female    DOB: Nov 18, 1971, 52 y.o.   MRN: [id number]  HPI Pt in for evaluation for slid upper eye lid droop for one week.  Pt notes day she noted slight droop to rt eye lid she had ha in the morning. Pt states had ha on awakening occipital to frontal. She states ha was 7/10. She states does not get ha. Reports no vision changes with ha initially(then remembers morning when working had slight blurred vision for 2 hours when had former ha). No nausea or vomiting. No light or sound sensitive with ha a week ago. No slurred speech one week. No weakness to upper or lower extremities. She did not have dizziness.    Pt states mild  rt upper eye lid pain rt side. No trauma. No dc to eye.   Pt vision check rt and left eye same on testing per pt.   Review of Systems  Constitutional:  Negative for chills, fatigue and fever.  Eyes:  Negative for photophobia, discharge, itching and visual disturbance.       See hpi. Slight drooping rt eye lid.   Respiratory:  Negative for choking, chest tightness, shortness of breath and wheezing.   Cardiovascular:  Negative for chest pain and palpitations.  Gastrointestinal:  Negative for abdominal pain.  Musculoskeletal:  Negative for back pain and joint swelling.  Neurological:  Negative for dizziness, syncope, weakness, numbness and headaches.  Hematological:  Negative for adenopathy. Does not bruise/bleed easily.   Past Medical History:  Diagnosis Date   Hyperlipidemia    Wellness examination 02/22/2015     Social History   Socioeconomic History   Marital status: Married    Spouse name: Not on file   Number of children: 3   Years of education: Not on file   Highest education level: GED or equivalent  Occupational History   Occupation: cook at [employer]  Tobacco Use   Smoking status: Never   Smokeless tobacco: Never  Vaping Use   Vaping status: Never Used  Substance and Sexual Activity   Alcohol  use: No    Alcohol/week: 0.0 standard drinks of alcohol   Drug use: No   Sexual activity: Yes    Partners: Male    Birth control/protection: Post-menopausal    Comment: PARAGUARD.. 1st intercourse- 17, partners- 1  Other Topics Concern   Not on file  Social History Narrative   Not on file   Social Determinants of Health   Financial Resource Strain: Low Risk  (08/13/2023)   Overall Financial Resource Strain (CARDIA)    Difficulty of Paying Living Expenses: Not very hard  Food Insecurity: No Food Insecurity (08/13/2023)   Hunger Vital Sign    Worried About Running Out of Food in the Last Year: Never true    Ran Out of Food in the Last Year: Never true  Transportation Needs: No Transportation Needs (08/13/2023)   PRAPARE - Administrator, Civil Service (Medical): No    Lack of Transportation (Non-Medical): No  Physical Activity: Insufficiently Active (08/13/2023)   Exercise Vital Sign    Days of Exercise per Week: 3 days    Minutes of Exercise per Session: 40 min  Stress: No Stress Concern Present (08/13/2023)   [employer] of Occupational Health - Occupational Stress Questionnaire    Feeling of Stress : Only a little  Social Connections: Moderately Integrated (08/13/2023)   Social Connection and Isolation Panel [NHANES]  Frequency of Communication with Friends and Family: More than three times a week    Frequency of Social Gatherings with Friends and Family: More than three times a week    Attends Religious Services: More than 4 times per year    Active Member of [employer] or Organizations: No    Attends Engineer, structural: Not on file    Marital Status: Married  Intimate Partner Violence: Unknown (03/31/2022)   Received from [facility]   HITS    Physically Hurt: Not on file    Insult or Talk Down To: Not on file    Threaten Physical Harm: Not on file    Scream or Curse: Not on file    Past Surgical History:  Procedure Laterality Date   COLPOSCOPY  W/ BIOPSY / CURETTAGE  03/27/2021   INTRAUTERINE DEVICE INSERTION  APRIL 2012   MIRENA    Family History  Problem Relation Age of Onset   Anxiety disorder Mother    Depression Mother    Hypertension Mother    Diabetes Sister    Hypertension Sister    Diabetes Brother    Diabetes Brother    Diabetes Brother     No Known Allergies  Current Outpatient Medications on File Prior to Visit  Medication Sig Dispense Refill   ibuprofen (ADVIL) 600 MG tablet Take 1 tablet (600 mg total) by mouth every 6 (six) hours as needed. 30 tablet 0   ondansetron (ZOFRAN-ODT) 8 MG disintegrating tablet Take 1 tablet (8 mg total) by mouth every 8 (eight) hours as needed for nausea or vomiting. 20 tablet 0   acetaminophen (TYLENOL) 325 MG tablet Take 2 tablets (650 mg total) by mouth every 6 (six) hours as needed for moderate pain. (Patient not taking: Reported on 08/13/2023) 30 tablet 0   No current facility-administered medications on file prior to visit.    BP 116/75   Pulse 76   Ht 4' 11.65" (1.515 m)   Wt 144 lb 6.4 oz (65.5 kg)   LMP 04/05/2021 (Exact Date)   SpO2 98%   BMI 28.54 kg/m        Objective:   Physical Exam   General Mental Status- Alert. General Appearance- Not in acute distress.   Skin General: Color- Normal Color. Moisture- Normal Moisture.  Neck Carotid Arteries- Normal color. Moisture- Normal Moisture. No carotid bruits. No JVD.  Chest and Lung Exam Auscultation: Breath Sounds:-Normal.  Cardiovascular Auscultation:Rythm- Regular. Murmurs & Other Heart Sounds:Auscultation of the heart reveals- No Murmurs.  Abdomen Inspection:-Inspeection Normal. Palpation/Percussion:Note:No mass. Palpation and Percussion of the abdomen reveal- Non Tender, Non Distended + BS, no rebound or guarding.   Neurologic Cranial Nerve exam:- CN III-XII intact(No nystagmus), symmetric smile. Drift Test:- No drift. Romberg Exam:- Negative.  Heal to Toe Gait exam:-Normal. Finger to  Nose:- Normal/Intact Strength:- 5/5 equal and symmetric strength both upper and lower extremities.    Eyes- perrl bilaterally, eom intact. Conjunctiva clear. Rt upper eye lid very sublte droop of rt eye lid compared to left side.  Able to open and close eyes well.    Assessment & Plan:   Patient Instructions  Recent severe headache events about 1 week ago with subsequent subtle right right upper lid droop that you noticed same day of headache.  The lid is still drooping minimally presently.  Otherwise neurologic exam is normal.   With the report of the severe headache and subtle eyelid droop do think is best to go ahead and get CT  of the head stat.  Consider other diagnoses such as Bell's palsy but the physical exam does not support that.  You do report some minimal right upper eyelid tenderness.  On exam I do not feel chalazion or stye.  Conjunctiva is clear.  Vision stable.  As we approach we can decide to go ahead and prescribe Tobrex.  Follow-up in 10 to 14 days or sooner if needed.  If you were to get any severe recurrent headache event with any motor or sensory deficits then be seen same day in the emergency department.   Esperanza Richters, PA-C

## 2023-08-13 NOTE — Patient Instructions (Signed)
Recent severe headache events about 1 week ago with subsequent subtle right right upper lid droop that you noticed same day of headache.  The lid is still drooping minimally presently.  Otherwise neurologic exam is normal.   With the report of the severe headache and subtle eyelid droop do think is best to go ahead and get CT of the head stat.  Consider other diagnoses such as Bell's palsy but the physical exam does not support that.  You do report some minimal right upper eyelid tenderness.  On exam I do not feel chalazion or stye.  Conjunctiva is clear.  Vision stable.  As we approach we can decide to go ahead and prescribe Tobrex.  Follow-up in 10 to 14 days or sooner if needed.  If you were to get any severe recurrent headache event with any motor or sensory deficits then be seen same day in the emergency department.

## 2023-08-16 ENCOUNTER — Telehealth: Payer: Self-pay

## 2023-08-16 NOTE — Telephone Encounter (Signed)
Using [employer], SPANISH Interpreter, [place], [place]: [id number], Pt has been advised of her results as well as the need for a referral to GYN for possible LEEP. Pt expressed understanding of this information. I also confirmed pt is a [place] Resident, therefore she will present today to complete paperwork for Medicaid.

## 2023-08-16 NOTE — Telephone Encounter (Signed)
-----   Message from Pascal Lux sent at 08/16/2023  1:28 PM EDT ----- Can we send her for evaluation with gynecology? Looks like my specimen picked up a minute area that may be HSIL.   Thanks! ----- Message ----- From: Interface, Lab In Three Zero One Sent: 08/13/2023  10:09 AM EDT To: Pascal Lux, NP

## 2023-08-16 NOTE — Telephone Encounter (Signed)
Pt has presented to complete the [facility] Medicaid application. We used the [employer], [place], [place]: [id number], for assistance to complete the application.

## 2023-08-19 ENCOUNTER — Other Ambulatory Visit: Payer: Self-pay

## 2023-08-19 DIAGNOSIS — R87610 Atypical squamous cells of undetermined significance on cytologic smear of cervix (ASC-US): Secondary | ICD-10-CM

## 2023-09-08 ENCOUNTER — Telehealth: Payer: Self-pay

## 2023-09-08 NOTE — Telephone Encounter (Signed)
[facility] Medicaid approved, 08/29/2023-02/25/2024, Retroactive 07/29/2023, MID: [id number]. Left message on voicemail requesting a return call.

## 2023-10-26 ENCOUNTER — Ambulatory Visit (INDEPENDENT_AMBULATORY_CARE_PROVIDER_SITE_OTHER): Payer: Medicaid Other | Admitting: Family Medicine

## 2023-10-26 ENCOUNTER — Encounter: Payer: Self-pay | Admitting: Family Medicine

## 2023-10-26 VITALS — BP 116/82 | HR 87 | Ht 61.0 in | Wt 142.2 lb

## 2023-10-26 DIAGNOSIS — Z87410 Personal history of cervical dysplasia: Secondary | ICD-10-CM | POA: Diagnosis not present

## 2023-10-26 NOTE — Assessment & Plan Note (Signed)
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 Discussed risks and benefits in detail as well as pathophysiology of HPV/cervical cancer etc. All questions answered. Reports no abnormal uterine bleeding, has about 1 year without her period at this point. Exam done to ensure able to do procedure in office, more than adequate cervical length, will schedule procedure date at check out.

## 2023-10-26 NOTE — Progress Notes (Deleted)
   [facility] GYNECOLOGY OFFICE VISIT NOTE  History:   Christy Taylor is a 52 y.o. G3P3 here today for ***.  ***  Health Maintenance Due  Topic Date Due   HIV Screening  Never done   Hepatitis C Screening  Never done   Colonoscopy  Never done   Zoster Vaccines- Shingrix (1 of 2) Never done   INFLUENZA VACCINE  Never done   COVID-19 Vaccine (1 - 2023-24 season) Never done    Past Medical History:  Diagnosis Date   Hyperlipidemia    Wellness examination 02/22/2015    Past Surgical History:  Procedure Laterality Date   COLPOSCOPY W/ BIOPSY / CURETTAGE  03/27/2021   INTRAUTERINE DEVICE INSERTION  APRIL 2012   MIRENA    The following portions of the patient's history were reviewed and updated as appropriate: allergies, current medications, past family history, past medical history, past social history, past surgical history and problem list.   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  Physical Exam:  BP 116/82   Pulse 87   Ht 5\' 1"  (1.549 m)   Wt 142 lb 3.2 oz (64.5 kg)   LMP 04/05/2021 (Exact Date)   BMI 26.87 kg/m  CONSTITUTIONAL: Well-developed, well-nourished female in no acute distress.  HEENT:  Normocephalic, atraumatic. External right and left ear normal. No scleral icterus.  NECK: Normal range of motion, supple, no masses noted on observation SKIN: No rash noted. Not diaphoretic. No erythema. No pallor. MUSCULOSKELETAL: Normal range of motion. No edema noted. NEUROLOGIC: Alert and oriented to person, place, and time. Normal muscle tone coordination.  PSYCHIATRIC: Normal mood and affect. Normal behavior. Normal judgment and thought  content. RESPIRATORY: Effort normal, no problems with respiration noted ABDOMEN: No masses noted. No other overt distention noted.  *** PELVIC: {Blank single:19197::"Deferred","Normal appearing external genitalia; normal appearing vaginal mucosa and cervix.  No abnormal discharge noted.  Normal uterine size, no other palpable masses, no uterine or adnexal tenderness."}  Labs and Imaging No results found for this or any previous visit (from the past 168 hour(s)). No results found.    Assessment and Plan:   Problem List Items Addressed This Visit   None   Routine preventative health maintenance measures emphasized. Please refer to After Visit Summary for other counseling recommendations.   No follow-ups on file.    Total face-to-face time with patient: {Blank single:19197::"10","15","20","25","30"} minutes.  Over 50% of encounter was spent on counseling and coordination of care.   Venora Maples, MD/MPH Attending Family Medicine Physician, [facility] for [facility], [facility]

## 2023-10-26 NOTE — Progress Notes (Signed)
[facility]ED [facility] GYNECOLOGY OFFICE VISIT NOTE  History:   Christy Taylor is a 52 y.o. G3P3 here today for consultation regarding abnormal colposcopy results.  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   ADEQPAP  03/10/2022 1016    Satisfactory for evaluation; transformation zone component PRESENT.    08/10/2023 - no visible lesions, ECC pathology: " FINAL MICROSCOPIC DIAGNOSIS:   A. ENDOCERVIX, CURETTAGE:  Dysplastic squamous fragments.  Benign endocervical mucosa.  See comment.   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  Immunohistochemistry for p16 supports  a diagnosis.  "   Health Maintenance Due  Topic Date Due   HIV Screening  Never done   Hepatitis C Screening  Never done   Colonoscopy  Never done   Zoster Vaccines- Shingrix (1 of 2) Never done   INFLUENZA VACCINE  Never done   COVID-19 Vaccine (1 - 2023-24 season) Never done    Past Medical History:  Diagnosis Date   Hyperlipidemia    Wellness examination 02/22/2015    Past Surgical History:  Procedure Laterality Date   COLPOSCOPY W/ BIOPSY / CURETTAGE  03/27/2021   INTRAUTERINE DEVICE INSERTION  APRIL 2012   MIRENA    The following portions of the patient's history were reviewed and updated as appropriate: allergies, current medications, past family history, past medical history, past social history, past surgical history and problem list.   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  Physical Exam:  BP 116/82   Pulse 87   Ht 5\' 1"  (1.549 m)   Wt 142 lb 3.2 oz (64.5 kg)   LMP 04/05/2021 (Exact Date)   BMI 26.87 kg/m  CONSTITUTIONAL: Well-developed, well-nourished female in no acute distress.  HEENT:  Normocephalic, atraumatic. External right and left ear normal. No scleral icterus.  NECK: Normal range of motion, supple, no masses noted on observation SKIN: No rash noted. Not diaphoretic. No erythema. No pallor. MUSCULOSKELETAL: Normal range of motion. No edema noted. NEUROLOGIC: Alert and oriented to person, place, and time. Normal muscle tone coordination.  PSYCHIATRIC: Normal mood and affect. Normal behavior. Normal judgment and thought content. RESPIRATORY: Effort normal, no problems with respiration noted ABDOMEN: No masses noted. No other overt distention noted.   PELVIC:  normal external genitalia, mild posterior prolapse, cervix with multiparous but otherwise normal appearance, 2-3 cm in length  Labs and Imaging No results found for this or any previous visit (from the past 168 hour(s)). No results found.    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 Discussed risks and benefits in detail as well as pathophysiology of HPV/cervical cancer etc. All questions answered. Reports no abnormal uterine bleeding, has about 1 year without her period at this point. Exam done  to ensure able to do procedure in office, more than adequate cervical length, will schedule procedure date at check out.        Routine preventative health maintenance measures emphasized. Please refer to After Visit Summary for other counseling recommendations.   Return in about 4 weeks (around 11/23/2023) for LEEP.    Total face-to-face time with patient: 20 minutes.  Over 50% of encounter was spent on counseling and coordination of care.   Venora Maples, MD/MPH Attending Family Medicine Physician, [facility] for [facility], [facility]

## 2023-11-16 ENCOUNTER — Encounter: Payer: Self-pay | Admitting: Family Medicine

## 2023-11-16 ENCOUNTER — Other Ambulatory Visit: Payer: Self-pay

## 2023-11-16 ENCOUNTER — Ambulatory Visit (INDEPENDENT_AMBULATORY_CARE_PROVIDER_SITE_OTHER): Payer: Medicaid Other | Admitting: Family Medicine

## 2023-11-16 ENCOUNTER — Other Ambulatory Visit (HOSPITAL_COMMUNITY)
Admission: RE | Admit: 2023-11-16 | Discharge: 2023-11-16 | Disposition: A | Discharge status: Home / Self Care | Payer: Medicaid Other | Source: Ambulatory Visit | Attending: Family Medicine | Admitting: Family Medicine

## 2023-11-16 VITALS — BP 121/70 | HR 82 | Wt 140.0 lb

## 2023-11-16 DIAGNOSIS — N871 Moderate cervical dysplasia: Secondary | ICD-10-CM | POA: Diagnosis not present

## 2023-11-16 DIAGNOSIS — Z01812 Encounter for preprocedural laboratory examination: Secondary | ICD-10-CM | POA: Diagnosis not present

## 2023-11-16 DIAGNOSIS — B977 Papillomavirus as the cause of diseases classified elsewhere: Secondary | ICD-10-CM | POA: Insufficient documentation

## 2023-11-16 DIAGNOSIS — R87810 Cervical high risk human papillomavirus (HPV) DNA test positive: Secondary | ICD-10-CM | POA: Diagnosis not present

## 2023-11-16 LAB — POCT PREGNANCY, URINE: Preg Test, Ur: NEGATIVE

## 2023-11-16 NOTE — Progress Notes (Signed)
   GYNECOLOGY OFFICE PROCEDURE NOTE  Christy Taylor is a 52 y.o. G3P3 here for LEEP. Pap smear and colposcopy history reviewed.    Pap:     Component Value Date/Time   DIAGPAP (A) 06/24/2023 1256    - Atypical squamous cells of undetermined significance (ASC-US)   DIAGPAP (A) 03/10/2022 1016    - Atypical squamous cells of undetermined significance (ASC-US)   HPVHIGH Positive (A) 06/24/2023 1256   HPVHIGH Positive (A) 03/10/2022 1016   ADEQPAP  06/24/2023 1256    Satisfactory for evaluation; transformation zone component PRESENT.   ADEQPAP  03/10/2022 1016    Satisfactory for evaluation; transformation zone component PRESENT.    Colpo Biopsy:  no visible lesions, no biopsy taken   ECC:   FINAL MICROSCOPIC DIAGNOSIS:   A. ENDOCERVIX, CURETTAGE:  Dysplastic squamous fragments.  Benign endocervical mucosa.  See comment.   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  Immunohistochemistry for p16 supports  a diagnosis.  "  Consent Risks, benefits, alternatives, and limitations of procedure explained to patient, including pain, bleeding, infection, failure to remove abnormal tissue and failure to cure dysplasia, need for repeat procedures, damage to pelvic organs, cervical incompetence.  Role of HPV,cervical dysplasia and need for close followup was empasized. Informed written consent was obtained. Urine pregnancy test was negative. All questions were answered. Time out performed.   ??Procedure: The patient was placed in lithotomy position and the bivalved coated speculum was placed in the patient's vagina. A grounding pad was placed on the patient. Acetic acid solution was applied to the cervix and areas of decreased uptake were noted around the transformation zone. Local anesthesia was administered via a paracervical block using 20 ml of  2% Lidocaine with epinephrine. The suction was turned on and the Medium 1X Fisher Cone Biopsy Excisor on 23 Watts of blended current was used to excise the area of decreased uptake and excise the entire transformation zone. Excellent hemostasis was achieved using roller ball coagulation set at 60 Watts coagulation current. Monsel's solution was then applied and the speculum was removed from the vagina. Specimens were sent to pathology.  ?The patient tolerated the procedure well. Post-operative instructions given to patient, including instruction to seek medical attention for persistent bright red bleeding, fever, abdominal/pelvic pain, dysuria, nausea or vomiting. She was also told about the possibility of having copious yellow to black tinged discharge for weeks. She was counseled to avoid anything in the vagina (sex/douching/tampons) for minimum 3 weeks. She has a 4 week post-operative check to assess wound healing, review results and discuss further management.    Venora Maples, MD/MPH Attending Family Medicine Physician, [facility] for [facility], [facility]

## 2023-11-18 LAB — SURGICAL PATHOLOGY

## 2023-11-18 NOTE — Progress Notes (Signed)
CIN 2 pathology with negative margins, no other higher grade lesions seen. Called patient, confirmed identity, discussed results, recommendation for 6 month repeat pap. She reports she has to go to [place] to take care of her father and will not return until January, will do f/u visit at that time.

## 2024-01-10 ENCOUNTER — Telehealth: Payer: Self-pay

## 2024-01-10 NOTE — Telephone Encounter (Signed)
 Patient left message with scheduler requesting return call regarding scheduling a follow-up visit. [employer] # [phone], [employer], Patient was contacted, identity confirmed, and informed per Dr. Merlynn 11/16/2023 Procedure visit notes, needs to schedule follow-up with Dr. Elise. Patient was given contact information. I also sent a message to schedulers at [facility].

## 2024-01-27 ENCOUNTER — Ambulatory Visit (INDEPENDENT_AMBULATORY_CARE_PROVIDER_SITE_OTHER): Payer: Medicaid Other | Admitting: Family Medicine

## 2024-01-27 ENCOUNTER — Encounter: Payer: Self-pay | Admitting: Family Medicine

## 2024-01-27 ENCOUNTER — Other Ambulatory Visit: Payer: Self-pay

## 2024-01-27 VITALS — BP 109/74 | HR 72 | Wt 144.9 lb

## 2024-01-27 DIAGNOSIS — N871 Moderate cervical dysplasia: Secondary | ICD-10-CM | POA: Diagnosis not present

## 2024-01-27 NOTE — Progress Notes (Signed)
GYNECOLOGY OFFICE VISIT NOTE  History:   Christy Taylor is a 53 y.o. G3P3 here today for LEEP follow up.   Patient reports she feels well Did not have any pain Bled for about a week but not since then  Health Maintenance Due  Topic Date Due   HIV Screening  Never done   Hepatitis C Screening  Never done   Colonoscopy  Never done   Zoster Vaccines- Shingrix (1 of 2) Never done   INFLUENZA VACCINE  Never done   COVID-19 Vaccine (1 - 2024-25 season) Never done    Past Medical History:  Diagnosis Date   Hyperlipidemia    Wellness examination 02/22/2015    Past Surgical History:  Procedure Laterality Date   COLPOSCOPY W/ BIOPSY / CURETTAGE  03/27/2021   INTRAUTERINE DEVICE INSERTION  APRIL 2012   MIRENA    The following portions of the patient's history were reviewed and updated as appropriate: allergies, current medications, past family history, past medical history, past social history, past surgical history and problem list.   Health Maintenance:   Last pap: Lab Results  Component Value Date   DIAGPAP (A) 06/24/2023    - Atypical squamous cells of undetermined significance (ASC-US)   HPVHIGH Positive (A) 06/24/2023    Colpo Biopsy:  no visible lesions, no biopsy taken    ECC:   FINAL MICROSCOPIC DIAGNOSIS:   A. ENDOCERVIX, CURETTAGE:  Dysplastic squamous fragments.  Benign endocervical mucosa.  See comment.   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  Immunohistochemistry for p16 supports  a diagnosis.  "    LEEP 11/16/2023 FINAL MICROSCOPIC DIAGNOSIS:   A. 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       Endocervical margin and exocervical margin are negative for  dysplasia.      No evidence of invasive carcinoma.    Last mammogram:  06/24/2023 -  birads  1    Review of Systems:  Pertinent items noted in HPI and remainder of comprehensive ROS otherwise negative.  Physical Exam:  BP 109/74   Pulse 72   Wt 144 lb 14.4 oz (65.7 kg)   LMP 04/05/2021 (Exact Date)   BMI 27.38 kg/m  CONSTITUTIONAL: Well-developed, well-nourished female in no acute distress.  HEENT:  Normocephalic, atraumatic. External right and left ear normal. No scleral icterus.  NECK: Normal range of motion, supple, no masses noted on observation SKIN: No rash noted. Not diaphoretic. No erythema. No pallor. MUSCULOSKELETAL: Normal range of motion. No edema noted. NEUROLOGIC: Alert and oriented to person, place, and time. Normal muscle tone coordination.  PSYCHIATRIC: Normal mood and affect. Normal behavior. Normal judgment and thought content. RESPIRATORY: Effort normal, no problems with respiration noted  PELVIC:  normal external genitalia. Some mild posterior prolapse. Vaginal vault unremarkable. Cervix with post LEEP appearance, very well healed, no lesions or discharge  Labs and Imaging No results found for this or any previous visit (from the past week). No results found.    Assessment and Plan:   Problem List Items Addressed This Visit       Genitourinary   Dysplasia of cervix, high grade CIN 2 - Primary   Well healed from LEEP. Reviewed pathology with CIN 2, negative margins. Discussed 1 year follow up pap in 10/2024, return sooner PRN.  Routine preventative health maintenance measures emphasized. Please refer to After Visit Summary for other counseling recommendations.   Return in about 9 months (around 10/30/2024) for repeat pap.    Total face-to-face time with patient: 20 minutes.  Over 50% of encounter was spent on counseling and coordination of care.   Venora Maples, MD/MPH Attending Family Medicine Physician, [facility] for [facility], [facility]

## 2024-01-27 NOTE — Assessment & Plan Note (Signed)
Well healed from LEEP. Reviewed pathology with CIN 2, negative margins. Discussed 1 year follow up pap in 10/2024, return sooner PRN.

## 2024-02-01 ENCOUNTER — Ambulatory Visit: Payer: Medicaid Other | Admitting: Family Medicine

## 2024-05-30 ENCOUNTER — Ambulatory Visit (INDEPENDENT_AMBULATORY_CARE_PROVIDER_SITE_OTHER): Payer: Self-pay | Admitting: Medical

## 2024-05-30 VITALS — BP 110/58 | HR 77 | Temp 98.0°F | Resp 18 | Ht 61.0 in | Wt 134.0 lb

## 2024-05-30 DIAGNOSIS — Z0000 Encounter for general adult medical examination without abnormal findings: Secondary | ICD-10-CM | POA: Diagnosis not present

## 2024-05-30 DIAGNOSIS — E119 Type 2 diabetes mellitus without complications: Secondary | ICD-10-CM

## 2024-05-30 DIAGNOSIS — Z1211 Encounter for screening for malignant neoplasm of colon: Secondary | ICD-10-CM

## 2024-05-30 DIAGNOSIS — D649 Anemia, unspecified: Secondary | ICD-10-CM | POA: Diagnosis not present

## 2024-05-30 NOTE — Progress Notes (Signed)
 Subjective:    Patient ID: Christy Taylor, female    DOB: 12-28-1971,No smoker, NO alcohol.  53 y.o.   MRN: [id number]  HPI  Pt works [employer], walking 4 days a week.No smoker, NO alcohol.  2 cups of coffee a day. 3 children.  Declines shingrix but will consider.  Up to date pap and mammogram. Needs to do colonoscopy   Christy Taylor is a 53 year old female with a history of fatty liver, prediabetes, and hypercholesterolemia who presents for follow-up on weight management and routine screenings.  She has experienced significant weight loss, decreasing from 168 pounds in April 2023 to 134 pounds currently. This weight loss is attributed to healthier eating habits she adopted after being diagnosed with fatty liver, prediabetes, and hypercholesterolemia.  She has not yet undergone a colonoscopy, which was initially scheduled for February 2024. She received a notification via My Chart but did not respond to schedule the appointment.  She completed a mammogram on June 24, 2023, which was normal. She also had a Pap smear on the same date last year, which was abnormal but showed a positive result for a virus. She recalls being advised to repeat the test in one year(end of this month).  She is not currently taking any medications but mentions using apple cider vinegar on an empty stomach.   Review of Systems See hpi.    Objective:   Physical Exam  General Mental Status- Alert. General Appearance- Not in acute distress.   Skin General: Color- Normal Color. Moisture- Normal Moisture.  Neck Carotid Arteries- Normal color. Moisture- Normal Moisture. No carotid bruits. No JVD.  Chest and Lung Exam Auscultation: Breath Sounds:-Normal.  Cardiovascular Auscultation:Rythm- Regular. Murmurs & Other Heart Sounds:Auscultation of the heart reveals- No Murmurs.  Abdomen Inspection:-Inspeection Normal. Palpation/Percussion:Note:No mass. Palpation and Percussion of the abdomen  reveal- Non Tender, Non Distended + BS, no rebound or guarding.   Neurologic Cranial Nerve exam:- CN III-XII intact(No nystagmus), symmetric smile. Strength:- 5/5 equal and symmetric strength both upper and lower extremities.       Assessment & Plan:  For you wellness exam today I have ordered cbc, cmp, lipid panel and A1c fasting  Vaccine shingrix declined  Recommend exercise and healthy diet.  We will let you know lab results as they come in.  Follow up date appointment will be determined after lab review.    Weight Loss Intentional weight loss due to dietary changes for fatty liver, prediabetes, and hyperlipidemia. Concern for potential unintentional weight loss due to underlying conditions. - Monitor weight regularly for stability. Do colonoscopy and let me know if any further weight loss.  Fatty Liver Managed through lifestyle modifications.  Prediabetes Managed through dietary changes. Average blood sugar level was 120 mg/dL last year without treatment. - Monitor blood sugar levels for control.  Hyperlipidemia Managed through dietary changes. Consumption of eggs may increase cholesterol levels.  Colorectal Cancer Screening Colonoscopy recommended in February 2024. Importance of screening emphasized. - Resend referral for colonoscopy. - Instruct her to contact gastroenterology office if no communication within a week.  Breast Cancer Screening Normal mammogram completed on June 24, 2023. Early repeat recommended. - Order mammogram.  Cervical Cancer Screening Pap smear positive for virus on June 24, 2023. Treatment completed. Importance of repeat Pap smear in 30 days emphasized. - Repeat Pap smear as scheduled with gyn  Shingles Vaccination Eligible for shingles vaccine, declined at this time.  Follow-up -date to be determined after lab review. Schedule lab next monday  early am fasting.  Christy Axtell, PA-C

## 2024-05-30 NOTE — Patient Instructions (Addendum)
 For you wellness exam today I have ordered cbc, cmp, lipid panel and A1c fasting  Vaccine shingrix declined  Recommend exercise and healthy diet.  We will let you know lab results as they come in.  Follow up date appointment will be determined after lab review.    Weight Loss Intentional weight loss due to dietary changes for fatty liv.esner, prediabetes, and hyperlipidemia. Concern for potential unintentional weight loss due to underlying conditions. - Monitor weight regularly for stability. Do colonoscopy and let me know if any further weight loss.  Fatty Liver Managed through lifestyle modifications.  Prediabetes Managed through dietary changes. Average blood sugar level was 120 mg/dL last year without treatment. - Monitor blood sugar levels for control.  Hyperlipidemia Managed through dietary changes. Consumption of eggs may increase cholesterol levels. -so placed lipid panel future.  Colorectal Cancer Screening Colonoscopy recommended in February 2024. Importance of screening emphasized. - Resend referral for colonoscopy. - Instruct her to contact gastroenterology office if no communication within a week.  Breast Cancer Screening Normal mammogram completed on June 24, 2023. Early repeat recommended. - Order mammogram.  Cervical Cancer Screening Pap smear positive for virus on June 24, 2023. Treatment completed. Importance of repeat Pap smear in 30 days emphasized. - Repeat Pap smear as scheduled with gyn  Shingles Vaccination Eligible for shingles vaccine, declined at this time.  Follow-up -date to be determined after lab review. Schedule lab next monday early am fasting.

## 2024-06-05 ENCOUNTER — Other Ambulatory Visit (INDEPENDENT_AMBULATORY_CARE_PROVIDER_SITE_OTHER): Payer: Self-pay

## 2024-06-05 DIAGNOSIS — Z0000 Encounter for general adult medical examination without abnormal findings: Secondary | ICD-10-CM | POA: Diagnosis not present

## 2024-06-05 DIAGNOSIS — E119 Type 2 diabetes mellitus without complications: Secondary | ICD-10-CM | POA: Diagnosis not present

## 2024-06-05 LAB — LIPID PANEL
Cholesterol: 104 mg/dL (ref 0–200)
HDL: 38.9 mg/dL — ABNORMAL LOW (ref >39.00)
LDL Cholesterol: 48 mg/dL (ref 0–99)
NonHDL: 65.26
Total CHOL/HDL Ratio: 3
Triglycerides: 86 mg/dL (ref 0.0–149.0)
VLDL: 17.2 mg/dL (ref 0.0–40.0)

## 2024-06-05 LAB — CBC WITH DIFFERENTIAL/PLATELET
Basophils Absolute: 0 10*3/uL (ref 0.0–0.1)
Basophils Relative: 0.2 % (ref 0.0–3.0)
Eosinophils Absolute: 0.1 10*3/uL (ref 0.0–0.7)
Eosinophils Relative: 2.4 % (ref 0.0–5.0)
HCT: 35.9 % — ABNORMAL LOW (ref 36.0–46.0)
Hemoglobin: 11.9 g/dL — ABNORMAL LOW (ref 12.0–15.0)
Lymphocytes Relative: 35.2 % (ref 12.0–46.0)
Lymphs Abs: 1.5 10*3/uL (ref 0.7–4.0)
MCHC: 33.1 g/dL (ref 30.0–36.0)
MCV: 77.6 fl — ABNORMAL LOW (ref 78.0–100.0)
Monocytes Absolute: 0.4 10*3/uL (ref 0.1–1.0)
Monocytes Relative: 9.5 % (ref 3.0–12.0)
Neutro Abs: 2.3 10*3/uL (ref 1.4–7.7)
Neutrophils Relative %: 52.7 % (ref 43.0–77.0)
Platelets: 135 10*3/uL — ABNORMAL LOW (ref 150.0–400.0)
RBC: 4.63 Mil/uL (ref 3.87–5.11)
RDW: 14.6 % (ref 11.5–15.5)
WBC: 4.4 10*3/uL (ref 4.0–10.5)

## 2024-06-05 LAB — COMPREHENSIVE METABOLIC PANEL WITH GFR
ALT: 19 U/L (ref 0–35)
AST: 15 U/L (ref 0–37)
Albumin: 3.1 g/dL — ABNORMAL LOW (ref 3.5–5.2)
Alkaline Phosphatase: 78 U/L (ref 39–117)
BUN: 9 mg/dL (ref 6–23)
CO2: 25 meq/L (ref 19–32)
Calcium: 8.8 mg/dL (ref 8.4–10.5)
Chloride: 109 meq/L (ref 96–112)
Creatinine, Ser: 0.26 mg/dL — ABNORMAL LOW (ref 0.40–1.20)
GFR: 125.85 mL/min (ref >60.00)
Glucose, Bld: 101 mg/dL — ABNORMAL HIGH (ref 70–99)
Potassium: 3.9 meq/L (ref 3.5–5.1)
Sodium: 141 meq/L (ref 135–145)
Total Bilirubin: 0.6 mg/dL (ref 0.2–1.2)
Total Protein: 5.8 g/dL — ABNORMAL LOW (ref 6.0–8.3)

## 2024-06-05 LAB — HEMOGLOBIN A1C: Hgb A1c MFr Bld: 5.9 % (ref 4.6–6.5)

## 2024-06-07 ENCOUNTER — Ambulatory Visit

## 2024-06-07 ENCOUNTER — Ambulatory Visit: Payer: Self-pay | Admitting: Medical

## 2024-06-07 DIAGNOSIS — D649 Anemia, unspecified: Secondary | ICD-10-CM

## 2024-06-07 LAB — IRON,TIBC AND FERRITIN PANEL
%SAT: 24 % (ref 16–45)
Ferritin: 11 ng/mL — ABNORMAL LOW (ref 16–232)
Iron: 104 ug/dL (ref 45–160)
TIBC: 432 ug/dL (ref 250–450)

## 2024-06-07 NOTE — Addendum Note (Signed)
 Addended by: Serafina Damme on: 06/07/2024 06:17 AM   Modules accepted: Orders

## 2024-06-08 MED ORDER — IRON (FERROUS SULFATE) 325 (65 FE) MG PO TABS: 325.0000 mg | ORAL_TABLET | Freq: Every day | ORAL | 0 refills | Status: DC

## 2024-06-08 NOTE — Addendum Note (Signed)
 Addended by: Serafina Damme on: 06/08/2024 04:28 PM   Modules accepted: Orders

## 2024-07-13 ENCOUNTER — Ambulatory Visit: Admitting: Obstetrics and Gynecology

## 2024-07-20 ENCOUNTER — Encounter: Payer: Self-pay | Admitting: Medical

## 2024-07-31 ENCOUNTER — Other Ambulatory Visit: Payer: Self-pay | Admitting: Medical

## 2024-08-10 ENCOUNTER — Other Ambulatory Visit (HOSPITAL_COMMUNITY)
Admission: RE | Admit: 2024-08-10 | Discharge: 2024-08-10 | Disposition: A | Discharge status: Home / Self Care | Source: Ambulatory Visit | Attending: Obstetrics and Gynecology | Admitting: Obstetrics and Gynecology

## 2024-08-10 ENCOUNTER — Ambulatory Visit: Admitting: Obstetrics and Gynecology

## 2024-08-10 VITALS — BP 112/50 | HR 74 | Wt 134.0 lb

## 2024-08-10 DIAGNOSIS — N871 Moderate cervical dysplasia: Secondary | ICD-10-CM | POA: Insufficient documentation

## 2024-08-10 DIAGNOSIS — Z87410 Personal history of cervical dysplasia: Secondary | ICD-10-CM | POA: Diagnosis not present

## 2024-08-10 DIAGNOSIS — B977 Papillomavirus as the cause of diseases classified elsewhere: Secondary | ICD-10-CM | POA: Insufficient documentation

## 2024-08-10 DIAGNOSIS — Z1331 Encounter for screening for depression: Secondary | ICD-10-CM | POA: Diagnosis not present

## 2024-08-10 DIAGNOSIS — R87810 Cervical high risk human papillomavirus (HPV) DNA test positive: Secondary | ICD-10-CM

## 2024-08-18 ENCOUNTER — Ambulatory Visit: Payer: Self-pay | Admitting: Obstetrics and Gynecology

## 2024-08-18 LAB — CYTOLOGY - PAP
Chlamydia: NEGATIVE
Comment: NEGATIVE
Comment: NEGATIVE
Comment: NEGATIVE
Comment: NORMAL
Diagnosis: NEGATIVE
High risk HPV: NEGATIVE
Neisseria Gonorrhea: NEGATIVE
Trichomonas: NEGATIVE

## 2024-08-23 NOTE — Progress Notes (Signed)
 GYNECOLOGY VISIT  Patient name: Christy Taylor MRN [id number]  Date of birth: Oct 07, 1971 Chief Complaint:   Gynecologic Exam   History:  Christy Taylor is a 53 y.o. G3P3 presents for pap smear. Underwent LEEP 10/2023. Since procedure, has not had any issues.   The following portions of the patient's history were reviewed and updated as appropriate: allergies, current medications, past family history, past medical history, past social history, past surgical history and problem list.   Health Maintenance:   Last pap     Component Value Date/Time   DIAGPAP  08/10/2024 1703    - Negative for intraepithelial lesion or malignancy (NILM)   DIAGPAP (A) 06/24/2023 1256    - Atypical squamous cells of undetermined significance (ASC-US )   DIAGPAP (A) 03/10/2022 1016    - Atypical squamous cells of undetermined significance (ASC-US )   HPVHIGH Negative 08/10/2024 1703   HPVHIGH Positive (A) 06/24/2023 1256   HPVHIGH Positive (A) 03/10/2022 1016   ADEQPAP  08/10/2024 1703    Satisfactory for evaluation; transformation zone component PRESENT.   ADEQPAP  06/24/2023 1256    Satisfactory for evaluation; transformation zone component PRESENT.   ADEQPAP  03/10/2022 1016    Satisfactory for evaluation; transformation zone component PRESENT.    Health Maintenance  Topic Date Due  . HIV Screening  Never done  . Yearly kidney health urinalysis for diabetes  Never done  . Hepatitis C Screening  Never done  . Pneumococcal Vaccine for age over 89 (1 of 2 - PCV) Never done  . Hepatitis B Vaccine (1 of 3 - 19+ 3-dose series) Never done  . Colon Cancer Screening  Never done  . Zoster (Shingles) Vaccine (1 of 2) Never done  . COVID-19 Vaccine (1 - 2024-25 season) Never done  . Flu Shot  07/28/2024  . Pap with HPV screening  02/10/2025  . Yearly kidney function blood test for diabetes  06/05/2025  . Mammogram  06/23/2025  . DTaP/Tdap/Td vaccine (3 - Td or Tdap) 07/03/2031  . HPV Vaccine  Aged  Out  . Meningitis B Vaccine  Aged Out      Review of Systems:  Pertinent items are noted in HPI. Comprehensive review of systems was otherwise negative.   Objective:  Physical Exam BP (!) 112/50   Pulse 74   Wt 134 lb (60.8 kg)   LMP 04/05/2021 (Exact Date)   BMI 25.32 kg/m    Physical Exam Vitals and nursing note reviewed. Exam conducted with a chaperone present.  Constitutional:      Appearance: Normal appearance.  HENT:     Head: Normocephalic and atraumatic.  Pulmonary:     Effort: Pulmonary effort is normal.     Breath sounds: Normal breath sounds.  Genitourinary:    General: Normal vulva.     Exam position: Lithotomy position.     Vagina: Normal.     Cervix: Normal.  Skin:    General: Skin is warm and dry.  Neurological:     General: No focal deficit present.     Mental Status: She is alert.  Psychiatric:        Mood and Affect: Mood normal.        Behavior: Behavior normal.        Thought Content: Thought content normal.        Judgment: Judgment normal.       Assessment & Plan:  1. 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 Dysplasia of cervix, high grade CIN 2  3. History of cervical dysplasia Now s/p post LEEP pap. Will follow up results.  - Cytology - PAP   Carter Quarry, MD [facility] for [facility], [facility]

## 2024-08-29 ENCOUNTER — Other Ambulatory Visit: Payer: Self-pay | Admitting: Medical

## 2024-10-30 ENCOUNTER — Telehealth: Payer: Self-pay | Admitting: Family Medicine

## 2024-10-30 NOTE — Telephone Encounter (Signed)
 Called patient to inform her that her visit has been rescheduled to 11/08/24 at 2:15 pm. Patient did not answer so I left a detailed message with our call back number asking to return our call if this time does not work for her.

## 2024-11-06 ENCOUNTER — Ambulatory Visit: Admitting: Obstetrics and Gynecology

## 2024-11-08 ENCOUNTER — Ambulatory Visit: Admitting: Obstetrics and Gynecology
